# Patient Record
Sex: Male | Born: 1935 | Race: Black or African American | Hispanic: No | Marital: Married | Smoking: Former smoker
Health system: Southern US, Community
[De-identification: ages and names within clinical notes are randomized; demographics above are authoritative.]

## PROBLEM LIST (undated history)

## (undated) DIAGNOSIS — M1990 Unspecified osteoarthritis, unspecified site: Secondary | ICD-10-CM

## (undated) DIAGNOSIS — R972 Elevated prostate specific antigen [PSA]: Secondary | ICD-10-CM

## (undated) DIAGNOSIS — I10 Essential (primary) hypertension: Secondary | ICD-10-CM

## (undated) DIAGNOSIS — E785 Hyperlipidemia, unspecified: Secondary | ICD-10-CM

## (undated) DIAGNOSIS — K8020 Calculus of gallbladder without cholecystitis without obstruction: Secondary | ICD-10-CM

## (undated) HISTORY — DX: Hyperlipidemia, unspecified: E78.5

## (undated) HISTORY — PX: TONSILLECTOMY: SUR1361

## (undated) HISTORY — DX: Unspecified osteoarthritis, unspecified site: M19.90

## (undated) HISTORY — DX: Calculus of gallbladder without cholecystitis without obstruction: K80.20

## (undated) HISTORY — PX: HERNIA REPAIR: SHX51

## (undated) HISTORY — DX: Essential (primary) hypertension: I10

## (undated) HISTORY — PX: PROSTATE BIOPSY: SHX241

## (undated) HISTORY — DX: Elevated prostate specific antigen (PSA): R97.20

---

## 2005-05-03 ENCOUNTER — Ambulatory Visit: Payer: Self-pay | Admitting: Internal Medicine

## 2005-06-11 ENCOUNTER — Ambulatory Visit: Payer: Self-pay | Admitting: Internal Medicine

## 2005-07-12 ENCOUNTER — Ambulatory Visit: Payer: Self-pay | Admitting: Internal Medicine

## 2005-08-13 ENCOUNTER — Ambulatory Visit: Payer: Self-pay | Admitting: Internal Medicine

## 2005-11-13 ENCOUNTER — Ambulatory Visit: Payer: Self-pay | Admitting: Internal Medicine

## 2006-02-13 ENCOUNTER — Ambulatory Visit: Payer: Self-pay | Admitting: Internal Medicine

## 2006-04-08 ENCOUNTER — Ambulatory Visit: Payer: Self-pay | Admitting: Internal Medicine

## 2006-06-24 ENCOUNTER — Ambulatory Visit: Payer: Self-pay | Admitting: Internal Medicine

## 2006-06-24 DIAGNOSIS — R972 Elevated prostate specific antigen [PSA]: Secondary | ICD-10-CM

## 2006-06-24 HISTORY — DX: Elevated prostate specific antigen (PSA): R97.20

## 2006-06-24 LAB — CONVERTED CEMR LAB
AST: 19 units/L (ref 0–37)
Albumin: 3.7 g/dL (ref 3.5–5.2)
Alkaline Phosphatase: 70 units/L (ref 39–117)
Creatinine, Ser: 1.3 mg/dL (ref 0.4–1.5)
Total Bilirubin: 0.9 mg/dL (ref 0.3–1.2)
Total CHOL/HDL Ratio: 5.5

## 2006-07-03 ENCOUNTER — Ambulatory Visit: Payer: Self-pay | Admitting: Internal Medicine

## 2006-07-14 ENCOUNTER — Telehealth: Payer: Self-pay | Admitting: Internal Medicine

## 2006-08-07 ENCOUNTER — Encounter: Payer: Self-pay | Admitting: Internal Medicine

## 2006-08-26 DIAGNOSIS — E785 Hyperlipidemia, unspecified: Secondary | ICD-10-CM

## 2006-08-26 DIAGNOSIS — I10 Essential (primary) hypertension: Secondary | ICD-10-CM

## 2006-08-26 HISTORY — DX: Hyperlipidemia, unspecified: E78.5

## 2006-08-26 HISTORY — DX: Essential (primary) hypertension: I10

## 2006-08-28 ENCOUNTER — Ambulatory Visit: Payer: Self-pay | Admitting: Internal Medicine

## 2006-09-03 ENCOUNTER — Encounter: Payer: Self-pay | Admitting: Internal Medicine

## 2006-11-25 ENCOUNTER — Ambulatory Visit: Payer: Self-pay | Admitting: Internal Medicine

## 2006-11-25 LAB — CONVERTED CEMR LAB
Cholesterol, target level: 200 mg/dL
LDL Goal: 100 mg/dL

## 2007-05-26 ENCOUNTER — Ambulatory Visit: Payer: Self-pay | Admitting: Internal Medicine

## 2007-10-06 ENCOUNTER — Ambulatory Visit: Payer: Self-pay | Admitting: Internal Medicine

## 2007-10-06 LAB — CONVERTED CEMR LAB
ALT: 20 units/L (ref 0–53)
Albumin: 4.1 g/dL (ref 3.5–5.2)
BUN: 16 mg/dL (ref 6–23)
Basophils Relative: 0.6 % (ref 0.0–3.0)
Calcium: 9.2 mg/dL (ref 8.4–10.5)
Creatinine, Ser: 1.4 mg/dL (ref 0.4–1.5)
Eosinophils Relative: 2.4 % (ref 0.0–5.0)
GFR calc Af Amer: 64 mL/min
Glucose, Bld: 112 mg/dL — ABNORMAL HIGH (ref 70–99)
HCT: 40.6 % (ref 39.0–52.0)
Hemoglobin: 13.8 g/dL (ref 13.0–17.0)
Monocytes Absolute: 0.5 10*3/uL (ref 0.1–1.0)
Monocytes Relative: 9.2 % (ref 3.0–12.0)
Neutro Abs: 3.3 10*3/uL (ref 1.4–7.7)
RBC: 4.39 M/uL (ref 4.22–5.81)
RDW: 12.1 % (ref 11.5–14.6)
Total CHOL/HDL Ratio: 4.1
Total Protein: 6.9 g/dL (ref 6.0–8.3)
Triglycerides: 83 mg/dL (ref 0–149)
WBC: 5.6 10*3/uL (ref 4.5–10.5)

## 2007-10-13 ENCOUNTER — Ambulatory Visit: Payer: Self-pay | Admitting: Internal Medicine

## 2007-12-07 ENCOUNTER — Telehealth: Payer: Self-pay | Admitting: Internal Medicine

## 2007-12-31 ENCOUNTER — Telehealth: Payer: Self-pay | Admitting: Internal Medicine

## 2008-04-12 ENCOUNTER — Ambulatory Visit: Payer: Self-pay | Admitting: Internal Medicine

## 2008-10-07 ENCOUNTER — Ambulatory Visit: Payer: Self-pay | Admitting: Internal Medicine

## 2008-10-07 LAB — CONVERTED CEMR LAB
Albumin: 4.1 g/dL (ref 3.5–5.2)
CO2: 29 meq/L (ref 19–32)
Calcium: 9.4 mg/dL (ref 8.4–10.5)
Chloride: 111 meq/L (ref 96–112)
Cholesterol: 160 mg/dL (ref 0–200)
Direct LDL: 106.3 mg/dL
Sodium: 146 meq/L — ABNORMAL HIGH (ref 135–145)
Total Protein: 7.4 g/dL (ref 6.0–8.3)

## 2008-12-05 ENCOUNTER — Telehealth: Payer: Self-pay | Admitting: Internal Medicine

## 2009-04-11 ENCOUNTER — Ambulatory Visit: Payer: Self-pay | Admitting: Internal Medicine

## 2009-06-16 ENCOUNTER — Ambulatory Visit: Payer: Self-pay | Admitting: Internal Medicine

## 2009-06-16 LAB — CONVERTED CEMR LAB
BUN: 19 mg/dL (ref 6–23)
Chloride: 108 meq/L (ref 96–112)
Cholesterol: 178 mg/dL (ref 0–200)
GFR calc non Af Amer: 68.72 mL/min (ref >60)
Glucose, Bld: 90 mg/dL (ref 70–99)
HDL: 43.3 mg/dL (ref >39.00)
Potassium: 4.7 meq/L (ref 3.5–5.1)
Sodium: 148 meq/L — ABNORMAL HIGH (ref 135–145)
TSH: 1.89 microintl units/mL (ref 0.35–5.50)

## 2009-08-21 ENCOUNTER — Ambulatory Visit: Payer: Self-pay | Admitting: Internal Medicine

## 2009-10-10 ENCOUNTER — Telehealth: Payer: Self-pay | Admitting: Internal Medicine

## 2009-11-20 ENCOUNTER — Ambulatory Visit: Payer: Self-pay | Admitting: Internal Medicine

## 2009-11-20 LAB — CONVERTED CEMR LAB
CO2: 33 meq/L — ABNORMAL HIGH (ref 19–32)
Calcium: 9.4 mg/dL (ref 8.4–10.5)
Creatinine, Ser: 1.4 mg/dL (ref 0.4–1.5)
Sodium: 145 meq/L (ref 135–145)

## 2010-02-01 ENCOUNTER — Telehealth: Payer: Self-pay | Admitting: Internal Medicine

## 2010-02-13 NOTE — Assessment & Plan Note (Signed)
Summary: 3 month fup//ccm   Vital Signs:  Patient profile:   77 year old male Height:      68 inches Weight:      172 pounds BMI:     26.25 Temp:     98.2 degrees F oral Pulse rate:   68 / minute Resp:     14 per minute BP sitting:   152 / 76  (left arm)  Vitals Entered By: Willy Eddy, LPN (November 20, 2009 1:35 PM) CC: roa, Hypertension Management Is Patient Diabetic? No   Primary Care Avigail Pilling:  Stacie Glaze MD  CC:  roa and Hypertension Management.  History of Present Illness: the pt is on simvastatin and amlodipine and one of these has to be changed no chest pain, no muscle aches reported no syncopy no edema hx of BPH lipids in fair control on zocar   Hypertension History:      He denies headache, chest pain, palpitations, dyspnea with exertion, orthopnea, PND, peripheral edema, visual symptoms, neurologic problems, syncope, and side effects from treatment.        Positive major cardiovascular risk factors include male age 10 years old or older, hyperlipidemia, and hypertension.  Negative major cardiovascular risk factors include non-tobacco-user status.        Further assessment for target organ damage reveals no history of ASHD, stroke/TIA, or peripheral vascular disease.     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 Once Daily 2)  Furosemide 40 Mg  Tabs (Furosemide) .... Once Daily 3)  Benicar 40 Mg Tabs (Olmesartan Medoxomil) .Marland Kitchen.. 1 Once Daily 4)  Zocor 40 Mg  Tabs (Simvastatin)  .... Once Daily 5)  Mobic 15 Mg  Tabs (Meloxicam) .... Once Daily As Needed 6)  Amlodipine Besylate 10 Mg Tabs (Amlodipine Besylate) .Marland Kitchen.. 1 Once Daily 7)  Ultram 50 Mg Tabs (Tramadol Hcl) .... With 1 Tylenol 500 Every 6 Hours As Needed Pain 8)  Bystolic 20 Mg Tabs (Nebivolol Hcl) .... One By Mouth Daily  Current Medications (verified): 1)  Multivitamins   Caps (Multiple Vitamin) .... Once Daily 2)  Furosemide 40 Mg  Tabs (Furosemide) .... Once Daily 3)  Azor 10-40 Mg Tabs (Amlodipine-Olmesartan) .... One By Mouth Daily 4)  Crestor 10 Mg Tabs (Rosuvastatin Calcium) .... One By Mouth Daily 5)  Mobic 15 Mg  Tabs (Meloxicam) .... Once Daily As Needed 6)  Ultram 50 Mg Tabs (Tramadol Hcl) .... With 1 Tylenol 500 Every 6 Hours As Needed Pain 7)  Bystolic 20 Mg Tabs (Nebivolol Hcl) .... One By Mouth Daily  Allergies (verified): 1)  ! Pcn  Contraindications/Deferment of Procedures/Staging:    Test/Procedure: FLU VAX    Reason for deferment: patient declined   Past History:  Family History: Last updated: 08/28/2006 father had COPD mother had CVA Family History of Stroke F 1st degree relative <60  Social History: Last updated: 08/26/2006 Retired Married Never Smoked Alcohol use-no Drug use-no Regular exercise-yes  Risk Factors: Exercise: yes (08/26/2006)  Risk Factors: Smoking Status: never (11/20/2009)  Past medical, surgical,  family and social histories (including risk factors) reviewed, and no changes noted (except as noted below).  Past Medical History: Reviewed history from 08/28/2006 and no changes required. Hyperlipidemia Hypertension Arthritis Syphilis High Cholesterol Positive TB skin test  Past Surgical History: Reviewed history from 08/28/2006 and no changes required. Tonsillectomy prostate BX Inguinal herniorrhaphy  Family History: Reviewed history from 08/28/2006 and no changes required. father had COPD mother had CVA Family History of Stroke F 1st  degree relative <60  Social History: Reviewed history from 08/26/2006 and no changes required. Retired Married Never Smoked Alcohol use-no Drug use-no Regular exercise-yes  Review of Systems  The patient denies anorexia, fever, weight loss, weight gain, vision loss, decreased hearing, hoarseness, chest pain, syncope, dyspnea on exertion, peripheral edema, prolonged cough, headaches, hemoptysis, abdominal pain, melena, hematochezia, severe indigestion/heartburn, hematuria, incontinence, genital sores, muscle weakness, suspicious skin lesions, transient blindness, difficulty walking, depression, unusual weight change, abnormal bleeding, enlarged lymph nodes, angioedema, breast masses, and testicular masses.    Physical Exam  General:  Well-developed,well-nourished,in no acute distress; alert,appropriate and cooperative throughout examination Head:  normocephalic.   Ears:  R ear normal and L ear normal.   Nose:  no nasal discharge.   Neck:  No deformities, masses, or tenderness noted. Lungs:  normal respiratory effort and no crackles.   Heart:  normal rate and regular rhythm.   Abdomen:  soft, non-tender, and distended.   Msk:  no joint tenderness, no joint swelling, and no joint warmth.   Extremities:  trace left pedal edema and trace right pedal edema.   Neurologic:  alert & oriented X3 and finger-to-nose normal.     Impression & Recommendations:  Problem # 1:  HYPERTENSION (ICD-401.9)  moderate control on 4 drugs with blod pressures from home in the 150/70 and no significant edema The following medications were removed from the medication list:    Amlodipine Besylate 10 Mg Tabs (Amlodipine besylate) .Marland Kitchen... 1 once daily His updated medication list for this problem includes:    Furosemide 40 Mg Tabs (Furosemide) ..... Once daily    Azor 10-40 Mg Tabs (Amlodipine-olmesartan) ..... One by mouth daily    Bystolic 20 Mg Tabs (Nebivolol hcl) ..... One by mouth daily  BP today:  152/76 Prior BP: 150/67 (08/21/2009)  Prior 10 Yr Risk Heart Disease: 33 % (04/12/2008)  Labs Reviewed: K+: 4.7 (06/16/2009) Creat: : 1.3 (06/16/2009)   Chol: 178 (06/16/2009)   HDL: 43.30 (06/16/2009)   LDL: 115 (10/06/2007)   TG: 83 (10/06/2007)  Orders: Venipuncture (16109) TLB-BMP (Basic Metabolic Panel-BMET) (80048-METABOL)  Problem # 2:  HYPERLIPIDEMIA (ICD-272.4) due to the interaction of zocar and norvasc must change His updated medication list for this problem includes:    Crestor 10 Mg Tabs (Rosuvastatin calcium) ..... One by mouth daily  Labs Reviewed: SGOT: 17 (10/07/2008)   SGPT: 17 (10/07/2008)  Lipid Goals: Chol Goal: 200 (11/25/2006)   HDL Goal: 40 (11/25/2006)   LDL Goal: 100 (11/25/2006)   TG Goal: 150 (11/25/2006)  Prior 10 Yr Risk Heart Disease: 33 % (04/12/2008)   HDL:43.30 (06/16/2009), 35.10 (10/07/2008)  LDL:115 (10/06/2007), 129 (06/24/2006)  Chol:178 (06/16/2009), 160 (10/07/2008)  Trig:83 (10/06/2007), 130 (06/24/2006)  Complete Medication List: 1)  Multivitamins Caps (Multiple vitamin) .... Once daily 2)  Furosemide 40 Mg Tabs (Furosemide) .... Once daily 3)  Azor 10-40 Mg Tabs (Amlodipine-olmesartan) .... One by mouth daily 4)  Crestor 10 Mg Tabs (Rosuvastatin calcium) .... One by mouth daily 5)  Mobic 15 Mg Tabs (Meloxicam) .Marland KitchenMarland KitchenMarland Kitchen  Once daily as needed 6)  Ultram 50 Mg Tabs (Tramadol hcl) .... With 1 tylenol 500 every 6 hours as needed pain 7)  Bystolic 20 Mg Tabs (Nebivolol hcl) .... One by mouth daily  Hypertension Assessment/Plan:      The patient's hypertensive risk group is category B: At least one risk factor (excluding diabetes) with no target organ damage.  His calculated 10 year risk of coronary heart disease is 33 %.  Today's blood pressure is 152/76.  His blood pressure goal is < 140/90.  Patient Instructions: 1)  the azor replaces the amlodipine and the benicar 2)  Please schedule a follow-up appointment in 3  months. Prescriptions: CRESTOR 10 MG TABS (ROSUVASTATIN CALCIUM) one by mouth daily  #30 x 11   Entered and Authorized by:   Stacie Glaze MD   Signed by:   Stacie Glaze MD on 11/20/2009   Method used:   Electronically to        [facility] [address] (retail)       [address]       [place], [place]  [address]       Ph: [phone]       Fax: [phone]   RxID:   [id number] AZOR 10-40 MG TABS (AMLODIPINE-OLMESARTAN) one by mouth daily  #30 x 11   Entered and Authorized by:   Stacie Glaze MD   Signed by:   Stacie Glaze MD on 11/20/2009   Method used:   Electronically to        [facility] [address] (retail)       [address]       [place], [place]  [address]       Ph: [phone]       Fax: [phone]   RxID:   [id number]    Orders Added: 1)  Est. Patient Level IV [30160] 2)  Venipuncture [10932] 3)  TLB-BMP (Basic Metabolic Panel-BMET) [80048-METABOL]  Appended Document: Orders Update    Clinical Lists Changes  Orders: Added new Service order of Specimen Handling ([id number]) - Signed

## 2010-02-13 NOTE — Assessment & Plan Note (Signed)
Summary: roa/bmw   Vital Signs:  Patient profile:   77 year old male Height:      68 inches Weight:      180 pounds BMI:     27.47 Temp:     98.1 degrees F oral Pulse rate:   76 / minute Resp:     14 per minute BP sitting:   150 / 80  (left arm)  Vitals Entered By: Willy Eddy, LPN (April 11, 2009 3:46 PM)  Nutrition Counseling: Patient's BMI is greater than 25 and therefore counseled on weight management options. CC: roa, Hypertension Management   CC:  roa and Hypertension Management.  History of Present Illness: reviewed the labs from the last visit with good reportson total  and LDL as well as renal and liver function being stable HTN in poor control and pulse is higher that one would suspect with the labetolol  Hypertension History:      He denies headache, chest pain, palpitations, dyspnea with exertion, orthopnea, PND, peripheral edema, visual symptoms, neurologic problems, syncope, and side effects from treatment.        Positive major cardiovascular risk factors include male age 91 years old or older, hyperlipidemia, and hypertension.  Negative major cardiovascular risk factors include non-tobacco-user status.     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 Once Daily 2)  Furosemide 40 Mg  Tabs (Furosemide) .... Once Daily 3)  Benicar 40 Mg Tabs (Olmesartan Medoxomil) .Marland Kitchen.. 1 Once Daily 4)  Zocor 40 Mg  Tabs (Simvastatin) .... Once Daily 5)  Labetalol Hcl 200 Mg  Tabs (Labetalol Hcl) .... 2 Two Times A Day 6)  Mobic 15 Mg  Tabs (Meloxicam) ....  Once Daily As Needed 7)  Amlodipine Besylate 10 Mg Tabs (Amlodipine Besylate) .Marland Kitchen.. 1 Once Daily 8)  Ultram 50 Mg Tabs (Tramadol Hcl) .... With 1 Tylenol 500 Every 6 Hours As Needed Pain  Current Medications (verified): 1)  Multivitamins   Caps (Multiple Vitamin) .... Once Daily 2)  Furosemide 40 Mg  Tabs (Furosemide) .... Once Daily 3)  Benicar 40 Mg Tabs (Olmesartan Medoxomil) .Marland Kitchen.. 1 Once Daily 4)  Zocor 40 Mg  Tabs (Simvastatin) .... Once Daily 5)  Mobic 15 Mg  Tabs (Meloxicam) .... Once Daily As Needed 6)  Amlodipine Besylate 10 Mg Tabs (Amlodipine Besylate) .Marland Kitchen.. 1 Once Daily 7)  Ultram 50 Mg Tabs (Tramadol Hcl) .... With 1 Tylenol 500 Every 6 Hours As Needed Pain 8)  Bystolic 10 Mg Tabs (Nebivolol Hcl) .... One By Mouth Daily  Allergies (verified): 1)  ! Pcn  Past History:  Family History: Last updated: 08/28/2006 father had COPD mother had CVA Family History of Stroke F 1st degree relative <60  Social History: Last updated: 08/26/2006 Retired Married Never Smoked Alcohol use-no Drug use-no Regular exercise-yes  Risk Factors: Exercise: yes (08/26/2006)  Risk Factors: Smoking Status: never (04/11/2009)  Past medical, surgical, family and social histories (including risk factors) reviewed, and no changes noted (except as noted below).  Past Medical History: Reviewed history from 08/28/2006 and no changes required. Hyperlipidemia Hypertension Arthritis Syphilis High Cholesterol Positive TB skin  test  Past Surgical History: Reviewed history from 08/28/2006 and no changes required. Tonsillectomy prostate BX Inguinal herniorrhaphy  Family History: Reviewed history from 08/28/2006 and no changes required. father had COPD mother had CVA Family History of Stroke F 1st degree relative <60  Social History: Reviewed history from 08/26/2006 and no changes required. Retired Married Never Smoked Alcohol use-no Drug use-no Regular exercise-yes  Review of  Systems  The patient denies anorexia, fever, weight loss, weight gain, vision loss, decreased hearing, hoarseness, chest pain, syncope, dyspnea on exertion, peripheral edema, prolonged cough, headaches, hemoptysis, abdominal pain, melena, hematochezia, severe indigestion/heartburn, hematuria, incontinence, genital sores, muscle weakness, suspicious skin lesions, transient blindness, difficulty walking, depression, unusual weight change, abnormal bleeding, enlarged lymph nodes, angioedema, and breast masses.    Physical Exam  General:  Well-developed,well-nourished,in no acute distress; alert,appropriate and cooperative throughout examination Head:  normocephalic.   Ears:  R ear normal and L ear normal.   Nose:  no nasal discharge.   Mouth:  Oral mucosa and oropharynx without lesions or exudates.  Teeth in good repair. Neck:  No deformities, masses, or tenderness noted. Lungs:  normal respiratory effort and no crackles.   Heart:  normal rate and regular rhythm.   Abdomen:  soft, non-tender, and distended.   Msk:  no joint tenderness, no joint swelling, and no joint warmth.   Pulses:  R and L carotid,radial,femoral,dorsalis pedis and posterior tibial pulses are full and equal bilaterally Extremities:  trace left pedal edema and trace right pedal edema.   Neurologic:  alert & oriented X3 and gait normal.     Impression & Recommendations:  Problem # 1:  HYPERTENSION (ICD-401.9)  poor control and need for intervention with change of BB drug The following medications were removed from the medication list:    Labetalol Hcl 200 Mg Tabs (Labetalol hcl) .Marland Kitchen... 2 two times a day His updated medication list for this problem includes:    Furosemide 40 Mg Tabs (Furosemide) ..... Once daily    Benicar 40 Mg Tabs (Olmesartan medoxomil) .Marland Kitchen... 1 once daily    Amlodipine Besylate 10 Mg Tabs (Amlodipine besylate) .Marland Kitchen... 1 once daily    Bystolic 10 Mg Tabs (Nebivolol hcl) ..... One by mouth daily  BP  today: 150/80 Prior BP: 146/72 (10/07/2008)  Prior 10 Yr Risk Heart Disease: 33 % (04/12/2008)  Labs Reviewed: K+: 4.4 (10/07/2008) Creat: : 1.7 (10/07/2008)   Chol: 160 (10/07/2008)   HDL: 35.10 (10/07/2008)   LDL: 115 (10/06/2007)   TG: 83 (10/06/2007)  Problem # 2:  HYPERLIPIDEMIA (ICD-272.4) stable His updated medication list for this problem includes:    Zocor 40 Mg Tabs (Simvastatin) ..... Once daily  Labs Reviewed: SGOT: 17 (10/07/2008)   SGPT: 17 (10/07/2008)  Lipid Goals: Chol Goal: 200 (11/25/2006)   HDL Goal: 40 (11/25/2006)   LDL Goal: 100 (11/25/2006)   TG Goal: 150 (11/25/2006)  Prior 10 Yr Risk Heart Disease: 33 % (04/12/2008)   HDL:35.10 (10/07/2008), 42.0 (10/06/2007)  LDL:115 (10/06/2007), 129 (06/24/2006)  Chol:160 (10/07/2008), 174 (10/06/2007)  Trig:83 (10/06/2007), 130 (06/24/2006)  Complete Medication List: 1)  Multivitamins Caps (Multiple vitamin) .... Once daily 2)  Furosemide 40 Mg Tabs (Furosemide) .... Once daily 3)  Benicar 40 Mg Tabs (Olmesartan medoxomil) .Marland Kitchen.. 1 once daily 4)  Zocor 40 Mg Tabs (Simvastatin) .... Once daily 5)  Mobic 15 Mg Tabs (Meloxicam) .... Once daily as needed 6)  Amlodipine Besylate 10 Mg Tabs (Amlodipine besylate) .Marland Kitchen.. 1 once daily 7)  Ultram 50 Mg  Tabs (Tramadol hcl) .... With 1 tylenol 500 every 6 hours as needed pain 8)  Bystolic 10 Mg Tabs (Nebivolol hcl) .... One by mouth daily  Hypertension Assessment/Plan:      The patient's hypertensive risk group is category B: At least one risk factor (excluding diabetes) with no target organ damage.  His calculated 10 year risk of coronary heart disease is 33 %.  Today's blood pressure is 150/80.  His blood pressure goal is < 140/90.  Patient Instructions: 1)  Please schedule a follow-up appointment in 2 months.

## 2010-02-13 NOTE — Assessment & Plan Note (Signed)
Summary: 2 month follow up/cjr/pt rescd from bump//ccm   Vital Signs:  Patient profile:   77 year old male Height:      68 inches Weight:      177 pounds BMI:     27.01 Temp:     98.2 degrees F oral Pulse rate:   80 / minute Resp:     14 per minute BP sitting:   157 / 80  (left arm)  Vitals Entered By: Willy Eddy, LPN (June 16, 1608 1:35 PM) CC: roa after changing lebatalol to bystolic, Hypertension Management   CC:  roa after changing lebatalol to bystolic and Hypertension Management.  History of Present Illness: the pt presents  for follow up of blood pressure   Hypertension History:      He denies headache, chest pain, palpitations, dyspnea with exertion, orthopnea, PND, peripheral edema, visual symptoms, neurologic problems, syncope, and side effects from treatment.  no chest pain.        Positive major cardiovascular risk factors include male age 61 years old or older, hyperlipidemia, and hypertension.  Negative major cardiovascular risk factors include non-tobacco-user status.     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 Once Daily 2)  Furosemide 40 Mg  Tabs (Furosemide) .... Once Daily 3)  Benicar 40 Mg Tabs (Olmesartan Medoxomil) .Marland Kitchen.. 1 Once Daily 4)  Zocor 40 Mg  Tabs (Simvastatin) .... Once Daily 5)  Mobic 15 Mg  Tabs (Meloxicam) .... Once Daily As Needed 6)  Amlodipine Besylate 10 Mg Tabs (Amlodipine Besylate) .Marland Kitchen.. 1 Once Daily 7)  Ultram 50 Mg Tabs (Tramadol Hcl) .... With 1 Tylenol 500 Every 6 Hours As Needed Pain 8)  Bystolic 20 Mg Tabs (Nebivolol Hcl) .... One By Mouth Daily  Allergies (verified): 1)  ! Pcn  Past History:  Family History: Last updated: 08/28/2006 father  had COPD mother had CVA Family History of Stroke F 1st degree relative <60  Social History: Last updated: 08/26/2006 Retired Married Never Smoked Alcohol use-no Drug use-no Regular exercise-yes  Risk Factors: Exercise: yes (08/26/2006)  Risk Factors: Smoking Status: never (06/16/2009)  Past medical, surgical, family and social histories (including risk factors) reviewed, and no changes noted (except as noted below).  Past Medical History: Reviewed history from 08/28/2006 and no changes required. Hyperlipidemia Hypertension Arthritis Syphilis High Cholesterol Positive TB skin test  Past Surgical History: Reviewed history from 08/28/2006 and no changes required. Tonsillectomy prostate BX Inguinal herniorrhaphy  Family History: Reviewed history from 08/28/2006 and no changes required. father had COPD mother had CVA Family History of Stroke F 1st degree relative <60  Social History: Reviewed history from 08/26/2006 and no changes required. Retired Married Never Smoked Alcohol use-no Drug use-no Regular exercise-yes  Review of Systems       The patient complains of anorexia.  The patient denies fever, weight loss, weight gain, vision loss, decreased hearing, hoarseness, chest pain, syncope, dyspnea on exertion, peripheral edema, prolonged cough, headaches, hemoptysis, abdominal pain, melena, hematochezia, severe indigestion/heartburn, hematuria, incontinence, genital sores, muscle weakness, suspicious skin lesions, transient blindness, difficulty walking, depression, unusual weight change, abnormal bleeding, enlarged lymph nodes, angioedema, and breast masses.    Physical Exam  General:  Well-developed,well-nourished,in no acute distress; alert,appropriate and cooperative throughout examination Head:  normocephalic.   Ears:  R ear normal and L ear normal.   Nose:  no nasal discharge.   Mouth:  Oral mucosa and oropharynx without lesions or exudates.  Teeth in  good repair. Neck:  No deformities, masses, or tenderness noted. Lungs:  normal respiratory effort and no crackles.   Heart:  normal rate and regular rhythm.   Abdomen:  soft, non-tender, and distended.   Msk:  no joint tenderness, no joint swelling, and no joint warmth.   Neurologic:  alert & oriented X3 and gait normal.     Impression & Recommendations:  Problem # 1:  HYPERTENSION (ICD-401.9) Assessment Deteriorated  slight increased in systollic over the last few reading indicates the need to increased the bystollic His updated medication list for this problem includes:    Furosemide 40 Mg Tabs (Furosemide) ..... Once daily    Benicar 40 Mg Tabs (Olmesartan medoxomil) .Marland Kitchen... 1 once daily    Amlodipine Besylate 10 Mg Tabs (Amlodipine besylate) .Marland Kitchen... 1 once daily    Bystolic 20 Mg Tabs (Nebivolol hcl) ..... One by mouth daily  BP today: 157/80 Prior BP: 150/80 (04/11/2009)  Prior 10 Yr Risk Heart Disease: 33 % (04/12/2008)  Labs Reviewed: K+: 4.4 (10/07/2008) Creat: : 1.7 (10/07/2008)   Chol: 160 (10/07/2008)   HDL: 35.10 (10/07/2008)   LDL: 115 (10/06/2007)   TG: 83 (10/06/2007)  Problem # 2:  HYPERLIPIDEMIA (ICD-272.4)  His updated medication list for this problem includes:    Zocor 40 Mg Tabs (Simvastatin) ..... Once daily  Orders: TLB-Cholesterol, HDL (83718-HDL) TLB-Cholesterol, Direct LDL (83721-DIRLDL) TLB-Cholesterol, Total (82465-CHO) TLB-TSH (Thyroid Stimulating Hormone) (84443-TSH)  Labs Reviewed: SGOT: 17 (10/07/2008)   SGPT: 17 (10/07/2008)  Lipid Goals: Chol Goal: 200 (11/25/2006)   HDL Goal: 40 (11/25/2006)   LDL Goal: 100 (11/25/2006)   TG Goal: 150 (11/25/2006)  Prior 10 Yr Risk Heart Disease: 33 % (04/12/2008)   HDL:35.10 (10/07/2008), 42.0 (10/06/2007)  LDL:115 (10/06/2007), 129 (06/24/2006)  Chol:160 (10/07/2008), 174 (10/06/2007)  Trig:83 (10/06/2007), 130 (06/24/2006)  Complete Medication List: 1)  Multivitamins Caps (Multiple vitamin) ....  Once daily 2)  Furosemide 40 Mg Tabs (Furosemide) .... Once daily 3)  Benicar 40 Mg Tabs (Olmesartan medoxomil) .Marland Kitchen.. 1 once daily 4)  Zocor 40 Mg Tabs (Simvastatin) .... Once daily 5)  Mobic 15 Mg Tabs (Meloxicam) .... Once daily as needed 6)  Amlodipine Besylate 10 Mg Tabs (Amlodipine besylate) .Marland Kitchen.. 1 once daily 7)  Ultram 50 Mg Tabs (Tramadol hcl) .... With 1 tylenol 500 every 6 hours as needed pain 8)  Bystolic 20 Mg Tabs (Nebivolol hcl) .... One by mouth daily  Other Orders: TLB-BMP (Basic Metabolic Panel-BMET) (80048-METABOL) Venipuncture (16109)  Hypertension Assessment/Plan:      The patient's hypertensive risk group is category B: At least one risk factor (excluding diabetes) with no target organ damage.  His calculated 10 year risk of coronary heart disease is 33 %.  Today's blood pressure is 157/80.  His blood pressure goal is < 140/90.  Patient Instructions: 1)  Please schedule a follow-up appointment in 2 months.

## 2010-02-13 NOTE — Assessment & Plan Note (Signed)
Summary: 2 month fup//ccm   Vital Signs:  Patient profile:   77 year old male Height:      68 inches Weight:      175 pounds BMI:     26.70 Temp:     98.2 degrees F oral Pulse rate:   68 / minute Resp:     14 per minute BP sitting:   150 / 67  (left arm)  Vitals Entered By: Willy Eddy, LPN (August 21, 2009 1:33 PM) CC: roa bp check, Hypertension Management, Lipid Management Is Patient Diabetic? No   CC:  roa bp check, Hypertension Management, and Lipid Management.  History of Present Illness: discussion of the medicatons and the recent labs the blood pressure was initially up today has been eating cured ham no chest pains, no SOB monterinf of lipids and drugs side effects: none  Hypertension History:      He denies headache, chest pain, palpitations, dyspnea with exertion, orthopnea, PND, peripheral edema, visual symptoms, neurologic problems, syncope, and side effects from treatment.  Further comments include: eating salt cured foods!.        Positive major cardiovascular risk factors include male age 22 years old or older, hyperlipidemia, and hypertension.  Negative major cardiovascular risk factors include non-tobacco-user status.        Further assessment for target organ damage reveals no history of ASHD, stroke/TIA, or peripheral vascular disease.    Lipid Management History:      Positive NCEP/ATP III risk factors include male age 37 years old or older and hypertension.  Negative NCEP/ATP III risk factors include non-tobacco-user status, no ASHD (atherosclerotic heart disease), no prior stroke/TIA, no peripheral vascular disease, and no history of aortic aneurysm.      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 Once Daily 2)  Furosemide 40 Mg  Tabs (Furosemide) .... Once Daily 3)  Benicar 40 Mg Tabs (Olmesartan Medoxomil) .Marland Kitchen.. 1 Once Daily 4)  Zocor 40 Mg  Tabs (Simvastatin) .... Once Daily 5)  Mobic 15 Mg  Tabs (Meloxicam) .... Once Daily As Needed 6)  Amlodipine Besylate 10 Mg Tabs (Amlodipine Besylate) .Marland Kitchen.. 1 Once Daily 7)  Ultram 50 Mg Tabs (Tramadol Hcl) .... With 1 Tylenol 500 Every 6 Hours As Needed Pain 8)  Bystolic 20 Mg Tabs (Nebivolol Hcl) .... One By Mouth Daily  Current Medications (verified): 1)  Multivitamins   Caps (Multiple Vitamin) .... Once Daily 2)  Furosemide 40 Mg  Tabs (Furosemide) .... Once Daily 3)  Benicar 40 Mg Tabs (Olmesartan Medoxomil) .Marland Kitchen.. 1 Once Daily 4)  Zocor 40 Mg  Tabs (Simvastatin) .... Once Daily 5)  Mobic 15 Mg  Tabs (Meloxicam) .... Once Daily As Needed 6)  Amlodipine Besylate 10 Mg Tabs (Amlodipine Besylate) .Marland Kitchen.. 1 Once Daily 7)  Ultram 50 Mg Tabs (Tramadol Hcl) .... With 1 Tylenol 500 Every 6 Hours As Needed Pain 8)  Bystolic 20 Mg Tabs (Nebivolol Hcl) .... One By Mouth Daily  Allergies (verified): 1)  ! Pcn  Past History:  Family History: Last updated: 08/28/2006 father had COPD mother had CVA Family History of  Stroke F 1st degree relative <60  Social History: Last updated: 08/26/2006 Retired Married Never Smoked Alcohol use-no Drug use-no Regular exercise-yes  Risk Factors: Exercise: yes (08/26/2006)  Risk Factors: Smoking Status: never (08/21/2009)  Past medical, surgical, family and social histories (including risk factors) reviewed, and no changes noted (except as noted below).  Past Medical History: Reviewed history from 08/28/2006 and no changes required. Hyperlipidemia Hypertension Arthritis Syphilis High Cholesterol Positive TB skin test  Past  Surgical History: Reviewed history from 08/28/2006 and no changes required. Tonsillectomy prostate BX Inguinal herniorrhaphy  Family History: Reviewed history from 08/28/2006 and no changes required. father had COPD mother had CVA Family History of Stroke F 1st degree relative <60  Social History: Reviewed history from 08/26/2006 and no changes required. Retired Married Never Smoked Alcohol use-no Drug use-no Regular exercise-yes  Review of Systems  The patient denies anorexia, fever, weight loss, weight gain, vision loss, decreased hearing, hoarseness, chest pain, syncope, dyspnea on exertion, peripheral edema, prolonged cough, headaches, hemoptysis, abdominal pain, melena, hematochezia, severe indigestion/heartburn, hematuria, incontinence, genital sores, muscle weakness, suspicious skin lesions, transient blindness, difficulty walking, depression, unusual weight change, abnormal bleeding, enlarged lymph nodes, angioedema, and breast masses.    Physical Exam  General:  Well-developed,well-nourished,in no acute distress; alert,appropriate and cooperative throughout examination Head:  normocephalic.   Ears:  R ear normal and L ear normal.   Nose:  no nasal discharge.   Mouth:  Oral mucosa and oropharynx without lesions or exudates.  Teeth in good repair. Neck:  No deformities, masses, or tenderness noted. Lungs:  normal respiratory effort and no crackles.   Heart:  normal rate and regular rhythm.   Abdomen:  soft, non-tender, and distended.   Msk:  no joint tenderness, no joint swelling, and no joint warmth.   Extremities:  trace left pedal edema and trace right pedal edema.   Neurologic:  alert & oriented X3 and gait normal.     Impression & Recommendations:  Problem # 1:  HYPERTENSION (ICD-401.9) Assessment Unchanged  His updated medication list for this problem includes:    Furosemide 40 Mg Tabs (Furosemide) ..... Once daily    Benicar 40 Mg Tabs (Olmesartan  medoxomil) .Marland Kitchen... 1 once daily    Amlodipine Besylate 10 Mg Tabs (Amlodipine besylate) .Marland Kitchen... 1 once daily    Bystolic 20 Mg Tabs (Nebivolol hcl) ..... One by mouth daily  BP today: 150/67 Prior BP: 157/80 (06/16/2009)  Prior 10 Yr Risk Heart Disease: 33 % (04/12/2008)  Labs Reviewed: K+: 4.7 (06/16/2009) Creat: : 1.3 (06/16/2009)   Chol: 178 (06/16/2009)   HDL: 43.30 (06/16/2009)   LDL: 115 (10/06/2007)   TG: 83 (10/06/2007)  Problem # 2:  HYPERLIPIDEMIA (ICD-272.4) Assessment: Unchanged  His updated medication list for this problem includes:    Zocor 40 Mg Tabs (Simvastatin) ..... Once daily  Labs Reviewed: SGOT: 17 (10/07/2008)   SGPT: 17 (10/07/2008)  Lipid Goals: Chol Goal: 200 (11/25/2006)   HDL Goal: 40 (11/25/2006)   LDL Goal: 100 (11/25/2006)   TG Goal: 150 (11/25/2006)  Prior 10 Yr Risk Heart Disease: 33 % (04/12/2008)   HDL:43.30 (06/16/2009), 35.10 (10/07/2008)  LDL:115 (10/06/2007), 129 (06/24/2006)  Chol:178 (06/16/2009), 160 (10/07/2008)  Trig:83 (10/06/2007), 130 (06/24/2006)  Problem # 3:  PROSTATE SPECIFIC ANTIGEN, ELEVATED (ICD-790.93) pr refused futher work up  Complete Medication List: 1)  Multivitamins Caps (Multiple vitamin) .... Once daily 2)  Furosemide 40 Mg Tabs (Furosemide) .... Once daily 3)  Benicar 40 Mg Tabs (Olmesartan medoxomil) .Marland KitchenMarland KitchenMarland Kitchen  1 once daily 4)  Zocor 40 Mg Tabs (Simvastatin) .... Once daily 5)  Mobic 15 Mg Tabs (Meloxicam) .... Once daily as needed 6)  Amlodipine Besylate 10 Mg Tabs (Amlodipine besylate) .Marland Kitchen.. 1 once daily 7)  Ultram 50 Mg Tabs (Tramadol hcl) .... With 1 tylenol 500 every 6 hours as needed pain 8)  Bystolic 20 Mg Tabs (Nebivolol hcl) .... One by mouth daily  Hypertension Assessment/Plan:      The patient's hypertensive risk group is category B: At least one risk factor (excluding diabetes) with no target organ damage.  His calculated 10 year risk of coronary heart disease is 33 %.  Today's blood pressure is 150/67.  His  blood pressure goal is < 140/90.  Lipid Assessment/Plan:      Based on NCEP/ATP III, the patient's risk factor category is "2 or more risk factors and a calculated 10 year CAD risk of > 20%".  The patient's lipid goals are as follows: Total cholesterol goal is 200; LDL cholesterol goal is 100; HDL cholesterol goal is 40; Triglyceride goal is 150.  His LDL cholesterol goal has not been met.  Secondary causes for hyperlipidemia have been ruled out.  He has been counseled on adjunctive measures for lowering his cholesterol and has been provided with dietary instructions.    Patient Instructions: 1)  Please schedule a follow-up appointment in 3 months.

## 2010-02-13 NOTE — Progress Notes (Signed)
Summary: samples  Phone Note Call from Patient Call back at Home Phone [phone]   Caller: Patient Call For: Stacie Glaze MD Summary of Call: pt needs more bystolic samples 10 mg or 20mg .wife will be here today Initial call taken by: Heron Sabins,  October 10, 2009 10:08 AM  Follow-up for Phone Call        given by dr Lovell Sheehan Follow-up by: Willy Eddy, LPN,  October 10, 2009 2:48 PM

## 2010-02-15 NOTE — Progress Notes (Signed)
Summary: samples needed  Phone Note Call from Patient Call back at Home Phone [phone]   Caller: Patient---live call Summary of Call: requesting more Bystolic samples until his appt next month. Initial call taken by: Warnell Forester,  February 01, 2010 9:37 AM    Prescriptions: BYSTOLIC 20 MG TABS (NEBIVOLOL HCL) one by mouth daily  #30 x 1   Entered by:   Willy Eddy, LPN   Authorized by:   Stacie Glaze MD   Signed by:   Willy Eddy, LPN on 29/56/2130   Method used:   Electronically to        [facility] [address] (retail)       [address]       [place], [place]  [address]       Ph: [phone]       Fax: [phone]   RxID:   [id number]

## 2010-02-27 ENCOUNTER — Other Ambulatory Visit: Payer: Self-pay | Admitting: Internal Medicine

## 2010-03-05 ENCOUNTER — Ambulatory Visit (INDEPENDENT_AMBULATORY_CARE_PROVIDER_SITE_OTHER): Payer: Medicare Other | Admitting: Internal Medicine

## 2010-03-05 ENCOUNTER — Encounter: Payer: Self-pay | Admitting: Internal Medicine

## 2010-03-05 VITALS — BP 150/80 | HR 72 | Temp 98.1°F | Resp 14 | Ht 68.0 in | Wt 170.0 lb

## 2010-03-05 DIAGNOSIS — I10 Essential (primary) hypertension: Secondary | ICD-10-CM

## 2010-03-05 DIAGNOSIS — E785 Hyperlipidemia, unspecified: Secondary | ICD-10-CM

## 2010-03-05 LAB — BASIC METABOLIC PANEL
BUN: 15 mg/dL (ref 6–23)
Creatinine, Ser: 1.3 mg/dL (ref 0.4–1.5)
GFR: 66.81 mL/min (ref >60.00)
Glucose, Bld: 118 mg/dL — ABNORMAL HIGH (ref 70–99)

## 2010-03-05 MED ORDER — AMLODIPINE-OLMESARTAN 10-40 MG PO TABS: 1.0000 | ORAL_TABLET | Freq: Every day | ORAL | Status: DC

## 2010-03-05 NOTE — Progress Notes (Signed)
  Subjective:    Patient ID: John Hutchinson, male    DOB: [birth year], 77 y.o.   MRN: [id number]  HPI  patient is a 77 year old African American male who presents for followup of his hypertension.  He was placed on a azor  10/40 daily and has tolerated the medicine without any side effects his blood pressures improved at 150/80 but not approximately at the goal we would like for him there may be problem in that he received some samples of 5/20 he was told to double anemia been taking only a single dose of those medications therefore we have given him only samples of 10/40 and will monitor his blood pressure over the next month.     Review of Systems  Constitutional: Negative for fever and fatigue.  HENT: Negative for hearing loss, congestion, neck pain and postnasal drip.   Eyes: Negative for discharge, redness and visual disturbance.  Respiratory: Negative for cough, shortness of breath and wheezing.   Cardiovascular: Negative for leg swelling.  Gastrointestinal: Negative for abdominal pain, constipation and abdominal distention.  Genitourinary: Negative for urgency and frequency.  Musculoskeletal: Negative for joint swelling and arthralgias.  Skin: Negative for color change and rash.  Neurological: Negative for weakness and light-headedness.  Hematological: Negative for adenopathy.  Psychiatric/Behavioral: Negative for behavioral problems.       Objective:   Physical Exam  Constitutional: He appears well-developed and well-nourished.  HENT:  Head: Normocephalic.  Right Ear: External ear normal.  Left Ear: External ear normal.  Eyes: Conjunctivae are normal. Pupils are equal, round, and reactive to light.  Neck: Normal range of motion. Neck supple.  Cardiovascular: Normal rate and regular rhythm.   Pulmonary/Chest: Effort normal and breath sounds normal.  Abdominal: Soft. Bowel sounds are normal.          Assessment & Plan:   patient is an elderly African American male who  is on multiple medicines for his hypertension his blood pressure is improved but not at goal this may be due to confusion over the samples given he is given samples only of the 10/40 and will monitor for compliance and in fact over the next

## 2010-03-05 NOTE — Patient Instructions (Signed)
Be sure to take the Azor or 40/10 one by mouth daily.

## 2010-04-17 ENCOUNTER — Telehealth: Payer: Self-pay | Admitting: Internal Medicine

## 2010-04-17 NOTE — Telephone Encounter (Signed)
Pt needs more samples of bystolic 20mg , azor 10-40mg  and crestor 10 mg

## 2010-04-18 NOTE — Telephone Encounter (Signed)
Samples of azor out front

## 2010-04-19 ENCOUNTER — Other Ambulatory Visit: Payer: Self-pay | Admitting: *Deleted

## 2010-04-19 MED ORDER — ROSUVASTATIN CALCIUM 10 MG PO TABS: 10.0000 mg | ORAL_TABLET | Freq: Every day | ORAL | Status: DC

## 2010-06-04 ENCOUNTER — Encounter: Payer: Self-pay | Admitting: Internal Medicine

## 2010-06-04 ENCOUNTER — Ambulatory Visit (INDEPENDENT_AMBULATORY_CARE_PROVIDER_SITE_OTHER): Payer: Medicare Other | Admitting: Internal Medicine

## 2010-06-04 VITALS — BP 155/84 | HR 92 | Temp 99.4°F | Ht 66.0 in | Wt 169.0 lb

## 2010-06-04 DIAGNOSIS — E785 Hyperlipidemia, unspecified: Secondary | ICD-10-CM

## 2010-06-04 DIAGNOSIS — I10 Essential (primary) hypertension: Secondary | ICD-10-CM

## 2010-06-04 DIAGNOSIS — M1990 Unspecified osteoarthritis, unspecified site: Secondary | ICD-10-CM

## 2010-06-04 NOTE — Progress Notes (Signed)
  Subjective:    Patient ID: John Hutchinson, male    DOB: 1933/02/12, 77 y.o.   MRN: [id number]  HPI Patient is a 77 year old male with a history of hypertension and hyperlipidemia.  He presents today with a history of hypertension and home readings of blood pressure that have been ranging from 140-130/70-60.  He's been compliant with his medication reports no side effects chest pain shortness of breath.  He does have a stressful role in that he cares for his wife He exercises 6 days a week He has moderately severe osteoarthritis and has been taking some nonsteroidals for pain relief   Review of Systems  Constitutional: Negative for fever and fatigue.  HENT: Negative for hearing loss, congestion, neck pain and postnasal drip.   Eyes: Negative for discharge, redness and visual disturbance.  Respiratory: Negative for cough, shortness of breath and wheezing.   Cardiovascular: Negative for leg swelling.  Gastrointestinal: Negative for abdominal pain, constipation and abdominal distention.  Genitourinary: Negative for urgency and frequency.  Musculoskeletal: Negative for joint swelling and arthralgias.  Skin: Negative for color change and rash.  Neurological: Negative for weakness and light-headedness.  Hematological: Negative for adenopathy.  Psychiatric/Behavioral: Negative for behavioral problems.       Objective:   Physical Exam    Blood pressure 155/84, pulse 92, temperature 99.4 F (37.4 C), temperature source Oral, height 5\' 6"  (1.676 m), weight 169 lb (76.658 kg). Elderly African American male in no apparent distress moderately disheveled today HEENT showed arcus senilis pupils were equal round reactive to light and accommodation neck was supple without bruit heart examination revealed a regular rate and rhythm a 1/6 systolic murmur. Abdomen was soft and nontender extremity examination no trace edema neurological examination found equal grips alert oriented to person place and  time appropriate judgment and cognition    Assessment & Plan:  Hypertension blood pressure stable his current medications the plan of changing on his Azor Yussef has moderately severe osteoarthritis his primary problem is after sitting he has a lot of joint stiffness which impairs his initial movement when she is moving he is doing well with her discontinuing them meloxicam and his exercise. He has tramadol for when necessary use for extreme pain

## 2010-07-24 ENCOUNTER — Other Ambulatory Visit: Payer: Self-pay | Admitting: Internal Medicine

## 2010-08-07 ENCOUNTER — Other Ambulatory Visit: Payer: Self-pay | Admitting: Internal Medicine

## 2010-09-06 ENCOUNTER — Other Ambulatory Visit: Payer: Self-pay | Admitting: Internal Medicine

## 2010-09-18 ENCOUNTER — Other Ambulatory Visit: Payer: Self-pay | Admitting: Internal Medicine

## 2010-10-04 ENCOUNTER — Other Ambulatory Visit: Payer: Self-pay | Admitting: Internal Medicine

## 2010-10-18 ENCOUNTER — Other Ambulatory Visit: Payer: Self-pay | Admitting: Internal Medicine

## 2010-10-29 ENCOUNTER — Ambulatory Visit (INDEPENDENT_AMBULATORY_CARE_PROVIDER_SITE_OTHER): Payer: Medicare Other | Admitting: Internal Medicine

## 2010-10-29 ENCOUNTER — Encounter: Payer: Self-pay | Admitting: Internal Medicine

## 2010-10-29 VITALS — BP 144/80 | HR 76 | Temp 98.6°F | Resp 16 | Ht 66.0 in | Wt 163.0 lb

## 2010-10-29 DIAGNOSIS — M25559 Pain in unspecified hip: Secondary | ICD-10-CM

## 2010-10-29 DIAGNOSIS — I10 Essential (primary) hypertension: Secondary | ICD-10-CM

## 2010-10-29 NOTE — Progress Notes (Signed)
  Subjective:    Patient ID: John Hutchinson, male    DOB: March 14, 1933, 77 y.o.   MRN: [id number]  HPI Hip pain with gate issues htn lipids    Review of Systems     Objective:   Physical Exam        Assessment & Plan:  Blood pressure check Tramadol for hip pain

## 2010-10-29 NOTE — Patient Instructions (Signed)
Patient was instructed to continue all medications as prescribed. To stop at the checkout desk and schedule a followup appointment Keep walking every day

## 2010-11-05 ENCOUNTER — Ambulatory Visit: Payer: Medicare Other | Admitting: Internal Medicine

## 2010-11-12 ENCOUNTER — Other Ambulatory Visit: Payer: Self-pay | Admitting: Internal Medicine

## 2011-02-08 ENCOUNTER — Other Ambulatory Visit: Payer: Self-pay | Admitting: Internal Medicine

## 2011-03-01 ENCOUNTER — Ambulatory Visit (INDEPENDENT_AMBULATORY_CARE_PROVIDER_SITE_OTHER): Payer: Medicare Other | Admitting: Internal Medicine

## 2011-03-01 VITALS — BP 146/80 | HR 76 | Temp 98.6°F | Resp 16 | Ht 66.0 in | Wt 160.0 lb

## 2011-03-01 DIAGNOSIS — E785 Hyperlipidemia, unspecified: Secondary | ICD-10-CM | POA: Diagnosis not present

## 2011-03-01 DIAGNOSIS — T887XXA Unspecified adverse effect of drug or medicament, initial encounter: Secondary | ICD-10-CM

## 2011-03-01 DIAGNOSIS — I10 Essential (primary) hypertension: Secondary | ICD-10-CM | POA: Diagnosis not present

## 2011-03-01 LAB — BASIC METABOLIC PANEL
BUN: 16 mg/dL (ref 6–23)
Calcium: 9.1 mg/dL (ref 8.4–10.5)
GFR: 72.2 mL/min (ref >60.00)
Glucose, Bld: 82 mg/dL (ref 70–99)
Potassium: 4.4 mEq/L (ref 3.5–5.1)
Sodium: 144 mEq/L (ref 135–145)

## 2011-03-01 LAB — LIPID PANEL
Cholesterol: 166 mg/dL (ref 0–200)
HDL: 51.6 mg/dL (ref >39.00)
LDL Cholesterol: 91 mg/dL (ref 0–99)
VLDL: 23.6 mg/dL (ref 0.0–40.0)

## 2011-03-01 LAB — HEPATIC FUNCTION PANEL
Albumin: 4.2 g/dL (ref 3.5–5.2)
Alkaline Phosphatase: 66 U/L (ref 39–117)
Total Bilirubin: 0.5 mg/dL (ref 0.3–1.2)

## 2011-03-01 LAB — CBC WITH DIFFERENTIAL/PLATELET
Basophils Absolute: 0.1 10*3/uL (ref 0.0–0.1)
Eosinophils Absolute: 0.1 10*3/uL (ref 0.0–0.7)
Lymphocytes Relative: 25.1 % (ref 12.0–46.0)
MCHC: 32.8 g/dL (ref 30.0–36.0)
MCV: 93.3 fl (ref 78.0–100.0)
Monocytes Absolute: 0.6 10*3/uL (ref 0.1–1.0)
Neutrophils Relative %: 64.7 % (ref 43.0–77.0)
Platelets: 254 10*3/uL (ref 150.0–400.0)

## 2011-03-01 LAB — TSH: TSH: 1.3 u[IU]/mL (ref 0.35–5.50)

## 2011-03-01 NOTE — Progress Notes (Signed)
Subjective:    Patient ID: John Hutchinson, male    DOB: January 23, 1933, 78 y.o.   MRN: [id number]  HPI Magda Kiel is a 78 year old African American male is followed for hypertension hyperlipidemia and osteoarthritis.  He has been on a combination drug of amlodipine and Benicar with 20 mg of diastolic control his blood pressure and has generally done well.  After 15 minutes his blood pressure was rechecked today and was 138/88. He has osteoarthritis he takes Mobic and we cautioned him not to take any additional over-the-counter medications such as Advil or Aleve because they could both affect his blood pressure and put him at risk for kidney damage and/or ulcers. He was given a prescription for Ultram to use in lieu of other nonsteroidals     Review of Systems  Constitutional: Negative for fever and fatigue.  HENT: Negative for hearing loss, congestion, neck pain and postnasal drip.   Eyes: Negative for discharge, redness and visual disturbance.  Respiratory: Negative for cough, shortness of breath and wheezing.   Cardiovascular: Negative for leg swelling.  Gastrointestinal: Negative for abdominal pain, constipation and abdominal distention.  Genitourinary: Negative for urgency and frequency.  Musculoskeletal: Negative for joint swelling and arthralgias.  Skin: Negative for color change and rash.  Neurological: Negative for weakness and light-headedness.  Hematological: Negative for adenopathy.  Psychiatric/Behavioral: Negative for behavioral problems.   Past Medical History  Diagnosis Date  . HYPERLIPIDEMIA 08/26/2006  . HYPERTENSION 08/26/2006  . PROSTATE SPECIFIC ANTIGEN, ELEVATED 06/24/2006    History   Social History  . Marital Status: Married    Spouse Name: N/A    Number of Children: N/A  . Years of Education: N/A   Occupational History  . retired    Social History Main Topics  . Smoking status: Never Smoker   . Smokeless tobacco: Not on file  . Alcohol Use: No  .  Drug Use: No  . Sexually Active: Yes   Other Topics Concern  . Not on file   Social History Narrative  . No narrative on file    Past Surgical History  Procedure Date  . Tonsillectomy   . Prostate biopsy   . Hernia repair     Family History  Problem Relation Age of Onset  . Stroke Mother   . COPD Father     Allergies  Allergen Reactions  . Penicillins     Current Outpatient Prescriptions on File Prior to Visit  Medication Sig Dispense Refill  . amLODipine-olmesartan (AZOR) 10-40 MG per tablet Take 1 tablet by mouth daily.      Marland Kitchen BYSTOLIC 20 MG TABS TAKE ONE TABLET BY MOUTH EVERY DAY  30 each  0  . meloxicam (MOBIC) 15 MG tablet TAKE ONE TABLET BY MOUTH EVERY DAY AS NEEDED  30 tablet  6  . Multiple Vitamin (MULTIVITAMIN) capsule Take 1 capsule by mouth daily.        . rosuvastatin (CRESTOR) 10 MG tablet Take 1 tablet (10 mg total) by mouth daily.  30 tablet  11  . traMADol (ULTRAM) 50 MG tablet TAKE ONE TABLET BY MOUTH EVERY 6 HOURS AS NEEDED FOR PAIN WITH TYLENOL 500MG   50 tablet  3    BP 146/80  Pulse 76  Temp 98.6 F (37 C)  Resp 16  Ht 5\' 6"  (1.676 m)  Wt 160 lb (72.576 kg)  BMI 25.82 kg/m2       Objective:   Physical Exam  Nursing note and vitals reviewed. Constitutional:  He appears well-developed and well-nourished.  HENT:  Head: Normocephalic and atraumatic.  Eyes: Conjunctivae are normal. Pupils are equal, round, and reactive to light.  Neck: Normal range of motion. Neck supple.  Cardiovascular: Normal rate and regular rhythm.   Pulmonary/Chest: Effort normal and breath sounds normal.  Abdominal: Soft. Bowel sounds are normal.  Musculoskeletal: He exhibits tenderness.          Assessment & Plan:  Cautioned about use of nonsteroidals as they may affect his blood pressure and given a prescription for Ultram 50 mg by mouth up to 3-4 times a day for arthritic pain.  Specifically he was cautioned not to take Advil or Aleve cause of their affect  on his blood pressure and his risk for renal disease as well as ulcer disease.  Samples of Crestor were given to aid in his compliance samples of diastolic were also given as well as samples of Azor. His blood pressure appears to be reasonably stable on his current regimen. He is on Lasix 40 mg by mouth daily and a basic metabolic panel should be monitored to see that his renal insufficiency is stable and his potassium is in range.

## 2011-04-10 ENCOUNTER — Other Ambulatory Visit: Payer: Self-pay | Admitting: Internal Medicine

## 2011-04-10 ENCOUNTER — Encounter: Payer: Self-pay | Admitting: Internal Medicine

## 2011-04-18 ENCOUNTER — Other Ambulatory Visit: Payer: Self-pay | Admitting: Internal Medicine

## 2011-05-07 ENCOUNTER — Other Ambulatory Visit: Payer: Self-pay | Admitting: Internal Medicine

## 2011-05-20 ENCOUNTER — Other Ambulatory Visit: Payer: Self-pay | Admitting: Internal Medicine

## 2011-05-29 ENCOUNTER — Ambulatory Visit: Payer: Medicare Other | Admitting: Internal Medicine

## 2011-05-29 ENCOUNTER — Encounter: Payer: Self-pay | Admitting: Internal Medicine

## 2011-05-29 ENCOUNTER — Ambulatory Visit (INDEPENDENT_AMBULATORY_CARE_PROVIDER_SITE_OTHER): Payer: Medicare Other | Admitting: Internal Medicine

## 2011-05-29 VITALS — BP 136/80 | HR 72 | Temp 98.2°F | Resp 16 | Ht 66.0 in | Wt 162.0 lb

## 2011-05-29 DIAGNOSIS — I10 Essential (primary) hypertension: Secondary | ICD-10-CM | POA: Diagnosis not present

## 2011-05-29 DIAGNOSIS — M1990 Unspecified osteoarthritis, unspecified site: Secondary | ICD-10-CM | POA: Diagnosis not present

## 2011-05-29 DIAGNOSIS — T887XXA Unspecified adverse effect of drug or medicament, initial encounter: Secondary | ICD-10-CM | POA: Diagnosis not present

## 2011-05-29 NOTE — Progress Notes (Signed)
Subjective:    Patient ID: John Hutchinson, male    DOB: 1934/01/10, 78 y.o.   MRN: [id number]  HPI Pain control inadequate for OA Blood pressure stable Monitoring for hyperlipidemia on Crestor   Review of Systems  Constitutional: Negative for fever and fatigue.  HENT: Negative for hearing loss, congestion, neck pain and postnasal drip.   Eyes: Negative for discharge, redness and visual disturbance.  Respiratory: Negative for cough, shortness of breath and wheezing.   Cardiovascular: Negative for leg swelling.  Gastrointestinal: Negative for abdominal pain, constipation and abdominal distention.  Genitourinary: Negative for urgency and frequency.  Musculoskeletal: Negative for joint swelling and arthralgias.  Skin: Negative for color change and rash.  Neurological: Negative for weakness and light-headedness.  Hematological: Negative for adenopathy.  Psychiatric/Behavioral: Negative for behavioral problems.   Past Medical History  Diagnosis Date  . HYPERLIPIDEMIA 08/26/2006  . HYPERTENSION 08/26/2006  . PROSTATE SPECIFIC ANTIGEN, ELEVATED 06/24/2006    History   Social History  . Marital Status: Married    Spouse Name: N/A    Number of Children: N/A  . Years of Education: N/A   Occupational History  . retired    Social History Main Topics  . Smoking status: Never Smoker   . Smokeless tobacco: Not on file  . Alcohol Use: No  . Drug Use: No  . Sexually Active: Yes   Other Topics Concern  . Not on file   Social History Narrative  . No narrative on file    Past Surgical History  Procedure Date  . Tonsillectomy   . Prostate biopsy   . Hernia repair     Family History  Problem Relation Age of Onset  . Stroke Mother   . COPD Father     Allergies  Allergen Reactions  . Penicillins     Current Outpatient Prescriptions on File Prior to Visit  Medication Sig Dispense Refill  . amLODipine-olmesartan (AZOR) 10-40 MG per tablet Take 1 tablet by mouth daily.       Marland Kitchen BYSTOLIC 20 MG TABS TAKE ONE TABLET BY MOUTH EVERY DAY  30 each  0  . furosemide (LASIX) 40 MG tablet TAKE ONE TABLET BY MOUTH EVERY DAY  30 tablet  6  . meloxicam (MOBIC) 15 MG tablet TAKE ONE TABLET BY MOUTH EVERY DAY AS NEEDED  30 tablet  6  . Multiple Vitamin (MULTIVITAMIN) capsule Take 1 capsule by mouth daily.        . rosuvastatin (CRESTOR) 10 MG tablet Take 1 tablet (10 mg total) by mouth daily.  30 tablet  11  . traMADol (ULTRAM) 50 MG tablet TAKE ONE TABLET BY MOUTH EVERY 6 HOURS AS NEEDED FOR PAIN WITH TYLENOL 500MG   90 tablet  3    BP 136/80  Pulse 72  Temp 98.2 F (36.8 C)  Resp 16  Ht 5\' 6"  (1.676 m)  Wt 162 lb (73.483 kg)  BMI 26.15 kg/m2       Objective:   Physical Exam  Constitutional: He appears well-developed and well-nourished.  HENT:  Head: Normocephalic and atraumatic.  Eyes: Conjunctivae are normal. Pupils are equal, round, and reactive to light.  Neck: Normal range of motion. Neck supple.  Cardiovascular: Normal rate and regular rhythm.   Pulmonary/Chest: Effort normal and breath sounds normal.  Abdominal: Soft. Bowel sounds are normal.          Assessment & Plan:  Taking one tramadol in the evening for OA pain Stable blood pressure with mild  peripheral edema on Lasix and Azor as well as bybystollic No evidence of CHF. Stable without chest pain Continued Crestor for hyperlipidemia Discussed at his age no longer following PSA

## 2011-05-29 NOTE — Patient Instructions (Signed)
The patient is instructed to continue all medications as prescribed. Schedule followup with check out clerk upon leaving the clinic  

## 2011-07-16 ENCOUNTER — Other Ambulatory Visit: Payer: Self-pay | Admitting: *Deleted

## 2011-07-16 ENCOUNTER — Telehealth: Payer: Self-pay | Admitting: Internal Medicine

## 2011-07-16 DIAGNOSIS — I10 Essential (primary) hypertension: Secondary | ICD-10-CM

## 2011-07-16 MED ORDER — AMLODIPINE-OLMESARTAN 10-40 MG PO TABS: 1.0000 | ORAL_TABLET | Freq: Every day | ORAL | Status: DC

## 2011-07-16 NOTE — Telephone Encounter (Signed)
Pt is on samples of Azor 10-40mg  and is currently out and is requesting more samples of for a rx to be called in to pharmacy  [facility] [address] [place]

## 2011-07-16 NOTE — Telephone Encounter (Signed)
Sent to pharmacy and pt informed 

## 2011-09-30 ENCOUNTER — Ambulatory Visit: Payer: Medicare Other | Admitting: Internal Medicine

## 2011-10-01 ENCOUNTER — Other Ambulatory Visit: Payer: Self-pay | Admitting: Internal Medicine

## 2011-10-10 ENCOUNTER — Encounter: Payer: Self-pay | Admitting: Internal Medicine

## 2011-10-10 ENCOUNTER — Ambulatory Visit (INDEPENDENT_AMBULATORY_CARE_PROVIDER_SITE_OTHER): Payer: Medicare Other | Admitting: Internal Medicine

## 2011-10-10 VITALS — BP 144/78 | HR 84 | Temp 98.2°F | Resp 16 | Ht 66.0 in | Wt 166.0 lb

## 2011-10-10 DIAGNOSIS — I10 Essential (primary) hypertension: Secondary | ICD-10-CM | POA: Diagnosis not present

## 2011-10-10 NOTE — Patient Instructions (Signed)
Samples of medication given to patient  The patient is instructed to continue all medications as prescribed. Schedule followup with check out clerk upon leaving the clinic

## 2011-10-10 NOTE — Progress Notes (Signed)
  Subjective:    Patient ID: John Hutchinson, male    DOB: 08-02-1933, 78 y.o.   MRN: [id number]  HPI Patient is a 78 year old male followed for hypertension hyperlipidemia and a history of osteoarthritis he presents today with an elevated blood pressure of 162/72   Review of Systems  Constitutional: Negative for fever and fatigue.  HENT: Negative for hearing loss, congestion, neck pain and postnasal drip.   Eyes: Negative for discharge, redness and visual disturbance.  Respiratory: Negative for cough, shortness of breath and wheezing.   Cardiovascular: Negative for leg swelling.  Gastrointestinal: Negative for abdominal pain, constipation and abdominal distention.  Genitourinary: Negative for urgency and frequency.  Musculoskeletal: Negative for joint swelling and arthralgias.  Skin: Negative for color change and rash.  Neurological: Negative for weakness and light-headedness.  Hematological: Negative for adenopathy.  Psychiatric/Behavioral: Negative for behavioral problems.       Objective:   Physical Exam  Nursing note and vitals reviewed. Constitutional: He appears well-developed and well-nourished.  HENT:  Head: Normocephalic and atraumatic.  Eyes: Conjunctivae normal are normal. Pupils are equal, round, and reactive to light.  Neck: Normal range of motion. Neck supple.  Cardiovascular: Normal rate and regular rhythm.   Murmur heard. Pulmonary/Chest: Effort normal and breath sounds normal.  Abdominal: Soft. Bowel sounds are normal.          Assessment & Plan:  He presents today for recheck of his blood pressure his blood pressure is well controlled on current regimen of Azor by systolic and Lasix.  He is out of his Crestor and samples of Crestor were given to aid in compliance

## 2011-11-05 ENCOUNTER — Other Ambulatory Visit: Payer: Self-pay | Admitting: Internal Medicine

## 2011-12-16 ENCOUNTER — Other Ambulatory Visit: Payer: Self-pay | Admitting: Internal Medicine

## 2011-12-31 ENCOUNTER — Other Ambulatory Visit: Payer: Self-pay | Admitting: Internal Medicine

## 2012-01-13 ENCOUNTER — Ambulatory Visit (INDEPENDENT_AMBULATORY_CARE_PROVIDER_SITE_OTHER): Payer: Medicare Other | Admitting: Internal Medicine

## 2012-01-13 ENCOUNTER — Encounter: Payer: Self-pay | Admitting: Internal Medicine

## 2012-01-13 VITALS — BP 140/80 | HR 80 | Temp 98.2°F | Resp 16 | Ht 66.0 in | Wt 164.0 lb

## 2012-01-13 DIAGNOSIS — I10 Essential (primary) hypertension: Secondary | ICD-10-CM | POA: Diagnosis not present

## 2012-01-13 DIAGNOSIS — M1990 Unspecified osteoarthritis, unspecified site: Secondary | ICD-10-CM | POA: Diagnosis not present

## 2012-01-13 MED ORDER — MELOXICAM 15 MG PO TABS: 15.0000 mg | ORAL_TABLET | Freq: Every day | ORAL | Status: DC

## 2012-01-13 NOTE — Patient Instructions (Signed)
The patient is instructed to continue all medications as prescribed. Schedule followup with check out clerk upon leaving the clinic  

## 2012-01-13 NOTE — Progress Notes (Signed)
  Subjective:    Patient ID: John Creger., male    DOB: 01-11-1934, 78 y.o.   MRN: [id number]  HPI Patient presents for followup of hypertension he is on Azor 10/40 and BYastolic 20.  His blood pressure is well-controlled He has osteoarthritis and requires a refill of his meloxicam.  He walks with a cane sometimes for safety he has not had falls   Review of Systems  Constitutional: Negative for fever and fatigue.  HENT: Negative for hearing loss, congestion, neck pain and postnasal drip.   Eyes: Negative for discharge, redness and visual disturbance.  Respiratory: Negative for cough, shortness of breath and wheezing.   Cardiovascular: Negative for leg swelling.  Gastrointestinal: Negative for abdominal pain, constipation and abdominal distention.  Genitourinary: Negative for urgency and frequency.  Musculoskeletal: Positive for myalgias, joint swelling, arthralgias and gait problem.  Skin: Negative for color change and rash.  Neurological: Negative for weakness and light-headedness.  Hematological: Negative for adenopathy.  Psychiatric/Behavioral: Negative for behavioral problems.       Objective:   Physical Exam  Nursing note and vitals reviewed. Constitutional: He appears well-developed and well-nourished.  HENT:  Head: Normocephalic and atraumatic.  Eyes: Conjunctivae normal are normal. Pupils are equal, round, and reactive to light.  Neck: Normal range of motion. Neck supple.  Cardiovascular: Normal rate and regular rhythm.   Pulmonary/Chest: Effort normal and breath sounds normal.  Abdominal: Soft. Bowel sounds are normal.          Assessment & Plan:  Stable on current medications samples of medications given to aid in compliance prescription for meloxicam sent to pharmacy [facility]

## 2012-04-28 ENCOUNTER — Other Ambulatory Visit: Payer: Self-pay | Admitting: Internal Medicine

## 2012-05-15 ENCOUNTER — Ambulatory Visit: Payer: Medicare Other | Admitting: Internal Medicine

## 2012-05-18 ENCOUNTER — Ambulatory Visit (INDEPENDENT_AMBULATORY_CARE_PROVIDER_SITE_OTHER): Payer: Medicare Other | Admitting: Internal Medicine

## 2012-05-18 ENCOUNTER — Encounter: Payer: Self-pay | Admitting: Internal Medicine

## 2012-05-18 VITALS — BP 170/70 | HR 72 | Temp 98.2°F | Resp 16 | Ht 66.0 in | Wt 168.0 lb

## 2012-05-18 DIAGNOSIS — I10 Essential (primary) hypertension: Secondary | ICD-10-CM

## 2012-05-18 MED ORDER — FUROSEMIDE 40 MG PO TABS: 60.0000 mg | ORAL_TABLET | Freq: Every day | ORAL | Status: DC

## 2012-05-18 NOTE — Patient Instructions (Addendum)
Now taking 1-1/2 furosemide 40 mg tablets

## 2012-05-18 NOTE — Progress Notes (Signed)
Subjective:    Patient ID: John Hutchinson., male    DOB: 01-09-1934, 79 y.o.   MRN: [id number]  HPI  Increased blood pressure without chest pain or SOB but moderate edema  Review of Systems  Constitutional: Positive for fatigue. Negative for fever.  HENT: Negative for hearing loss, congestion, neck pain and postnasal drip.   Eyes: Negative for discharge, redness and visual disturbance.  Respiratory: Positive for shortness of breath. Negative for cough and wheezing.   Cardiovascular: Positive for leg swelling.  Gastrointestinal: Negative for abdominal pain, constipation and abdominal distention.  Genitourinary: Negative for urgency and frequency.  Musculoskeletal: Negative for joint swelling and arthralgias.  Skin: Negative for color change and rash.  Neurological: Negative for weakness and light-headedness.  Hematological: Negative for adenopathy.  Psychiatric/Behavioral: Negative for behavioral problems.   Past Medical History  Diagnosis Date  . HYPERLIPIDEMIA 08/26/2006  . HYPERTENSION 08/26/2006  . PROSTATE SPECIFIC ANTIGEN, ELEVATED 06/24/2006    History   Social History  . Marital Status: Married    Spouse Name: N/A    Number of Children: N/A  . Years of Education: N/A   Occupational History  . retired    Social History Main Topics  . Smoking status: Never Smoker   . Smokeless tobacco: Not on file  . Alcohol Use: No  . Drug Use: No  . Sexually Active: Yes   Other Topics Concern  . Not on file   Social History Narrative  . No narrative on file    Past Surgical History  Procedure Laterality Date  . Tonsillectomy    . Prostate biopsy    . Hernia repair      Family History  Problem Relation Age of Onset  . Stroke Mother   . COPD Father     Allergies  Allergen Reactions  . Penicillins     Current Outpatient Prescriptions on File Prior to Visit  Medication Sig Dispense Refill  . BYSTOLIC 20 MG TABS TAKE ONE TABLET BY MOUTH EVERY DAY  30 tablet   11  . Multiple Vitamin (MULTIVITAMIN) capsule Take 1 capsule by mouth daily.        . rosuvastatin (CRESTOR) 10 MG tablet Take 1 tablet (10 mg total) by mouth daily.  30 tablet  11  . traMADol (ULTRAM) 50 MG tablet TAKE ONE TABLET BY MOUTH EVERY 6 HOURS AS NEEDED FOR PAIN WITH TYLENOL 500MG   90 tablet  2  . AZOR 10-40 MG per tablet TAKE ONE TABLET BY MOUTH EVERY DAY  30 tablet  0  . meloxicam (MOBIC) 15 MG tablet Take 1 tablet (15 mg total) by mouth daily.  30 tablet  11   No current facility-administered medications on file prior to visit.    BP 170/70  Pulse 72  Temp(Src) 98.2 F (36.8 C)  Resp 16  Ht 5\' 6"  (1.676 m)  Wt 168 lb (76.204 kg)  BMI 27.13 kg/m2       Objective:   Physical Exam  Nursing note and vitals reviewed. Constitutional: He appears well-developed and well-nourished.  HENT:  Head: Normocephalic and atraumatic.  Eyes: Conjunctivae are normal. Pupils are equal, round, and reactive to light.  Neck: Normal range of motion. Neck supple.  Cardiovascular: Normal rate and regular rhythm.   Murmur heard. Pulmonary/Chest: Effort normal and breath sounds normal.  Abdominal: Soft. Bowel sounds are normal.  Musculoskeletal: He exhibits edema.          Assessment & Plan:  Increased lasix to  60 mg and monitor pressure Monitor bmet today If blood pressure remains high consider RAS vs progressive renal dz

## 2012-05-19 LAB — BASIC METABOLIC PANEL
Chloride: 105 mEq/L (ref 96–112)
GFR: 65.3 mL/min (ref >60.00)
Potassium: 4.1 mEq/L (ref 3.5–5.1)
Sodium: 142 mEq/L (ref 135–145)

## 2012-05-25 ENCOUNTER — Other Ambulatory Visit: Payer: Self-pay | Admitting: Internal Medicine

## 2012-06-16 ENCOUNTER — Other Ambulatory Visit: Payer: Self-pay | Admitting: Internal Medicine

## 2012-07-09 ENCOUNTER — Telehealth: Payer: Self-pay | Admitting: Internal Medicine

## 2012-07-09 MED ORDER — TRAMADOL HCL 50 MG PO TABS: ORAL_TABLET | ORAL | Status: DC

## 2012-07-09 MED ORDER — AMLODIPINE-OLMESARTAN 10-40 MG PO TABS: ORAL_TABLET | ORAL | Status: DC

## 2012-07-09 MED ORDER — ROSUVASTATIN CALCIUM 10 MG PO TABS: 10.0000 mg | ORAL_TABLET | Freq: Every day | ORAL | Status: DC

## 2012-07-09 NOTE — Telephone Encounter (Signed)
done

## 2012-07-09 NOTE — Telephone Encounter (Signed)
Pt needs new rx crestor 10 mg#30 with refills pt had samples. Pt also needs refill on azor 10-40 mg and tramadol 50mg  pt will take with tylenol 500mg   call into [facility] [phone]

## 2012-09-28 ENCOUNTER — Ambulatory Visit (INDEPENDENT_AMBULATORY_CARE_PROVIDER_SITE_OTHER): Payer: Medicare Other | Admitting: Internal Medicine

## 2012-09-28 ENCOUNTER — Encounter: Payer: Self-pay | Admitting: Internal Medicine

## 2012-09-28 VITALS — BP 155/88 | HR 76 | Temp 98.2°F | Resp 16 | Ht 66.0 in | Wt 170.0 lb

## 2012-09-28 DIAGNOSIS — M1990 Unspecified osteoarthritis, unspecified site: Secondary | ICD-10-CM

## 2012-09-28 DIAGNOSIS — I10 Essential (primary) hypertension: Secondary | ICD-10-CM | POA: Diagnosis not present

## 2012-09-28 DIAGNOSIS — E785 Hyperlipidemia, unspecified: Secondary | ICD-10-CM

## 2012-09-28 DIAGNOSIS — N138 Other obstructive and reflux uropathy: Secondary | ICD-10-CM

## 2012-09-28 DIAGNOSIS — N401 Enlarged prostate with lower urinary tract symptoms: Secondary | ICD-10-CM | POA: Diagnosis not present

## 2012-09-28 MED ORDER — TERAZOSIN HCL 2 MG PO CAPS: 2.0000 mg | ORAL_CAPSULE | Freq: Every day | ORAL | Status: DC

## 2012-09-28 NOTE — Progress Notes (Signed)
Subjective:    Patient ID: John Enriques., male    DOB: Nov 09, 1933, 79 y.o.   MRN: [id number]  HPI  This is a 79 year old male who is followed for hypertension and for her osteoarthritis as well as a history of hyperlipidemia.  He is on blood pressure medications and medications for his cholesterol.  He presents today for routine followup states that he is doing well in fact he is walking better with and without his cane  Review of Systems  Constitutional: Negative for fever and fatigue.  HENT: Positive for postnasal drip. Negative for hearing loss, congestion and neck pain.   Eyes: Negative for discharge, redness and visual disturbance.  Respiratory: Negative for cough, shortness of breath and wheezing.   Cardiovascular: Negative for leg swelling.  Gastrointestinal: Negative for abdominal pain, constipation and abdominal distention.  Genitourinary: Negative for urgency and frequency.  Musculoskeletal: Positive for joint swelling and gait problem. Negative for arthralgias.  Skin: Negative for color change and rash.  Neurological: Negative for weakness and light-headedness.  Hematological: Negative for adenopathy.  Psychiatric/Behavioral: Negative for behavioral problems.   Past Medical History  Diagnosis Date  . HYPERLIPIDEMIA 08/26/2006  . HYPERTENSION 08/26/2006  . PROSTATE SPECIFIC ANTIGEN, ELEVATED 06/24/2006    History   Social History  . Marital Status: Married    Spouse Name: N/A    Number of Children: N/A  . Years of Education: N/A   Occupational History  . retired    Social History Main Topics  . Smoking status: Never Smoker   . Smokeless tobacco: Not on file  . Alcohol Use: No  . Drug Use: No  . Sexual Activity: Yes   Other Topics Concern  . Not on file   Social History Narrative  . No narrative on file    Past Surgical History  Procedure Laterality Date  . Tonsillectomy    . Prostate biopsy    . Hernia repair      Family History  Problem  Relation Age of Onset  . Stroke Mother   . COPD Father     Allergies  Allergen Reactions  . Penicillins     Current Outpatient Prescriptions on File Prior to Visit  Medication Sig Dispense Refill  . amLODipine-olmesartan (AZOR) 10-40 MG per tablet TAKE ONE TABLET BY MOUTH ONCE DAILY  30 tablet  11  . BYSTOLIC 20 MG TABS TAKE ONE TABLET BY MOUTH EVERY DAY  30 tablet  11  . furosemide (LASIX) 40 MG tablet Take 1.5 tablets (60 mg total) by mouth daily.  60 tablet  11  . meloxicam (MOBIC) 15 MG tablet Take 1 tablet (15 mg total) by mouth daily.  30 tablet  11  . Multiple Vitamin (MULTIVITAMIN) capsule Take 1 capsule by mouth daily.        . rosuvastatin (CRESTOR) 10 MG tablet Take 1 tablet (10 mg total) by mouth daily.  30 tablet  11  . traMADol (ULTRAM) 50 MG tablet TAKE ONE TABLET BY MOUTH EVERY 6 HOURS AS NEEDED FOR PAIN. TAKE WITH TYLENOL 500MG   90 tablet  3   No current facility-administered medications on file prior to visit.    BP 155/88  Pulse 76  Temp(Src) 98.2 F (36.8 C)  Resp 16  Ht 5\' 6"  (1.676 m)  Wt 170 lb (77.111 kg)  BMI 27.45 kg/m2       Objective:   Physical Exam  Constitutional: He appears well-developed and well-nourished.  HENT:  Head: Normocephalic and atraumatic.  Eyes: Conjunctivae are normal. Pupils are equal, round, and reactive to light.  Neck: Normal range of motion. Neck supple.  Cardiovascular: Normal rate and regular rhythm.   Murmur heard. Pulmonary/Chest: Effort normal and breath sounds normal.  Abdominal: Soft. Bowel sounds are normal.          Assessment & Plan:

## 2012-09-28 NOTE — Patient Instructions (Addendum)
The patient is instructed to continue all medications as prescribed. Schedule followup with check out clerk upon leaving the clinic  

## 2012-10-26 ENCOUNTER — Other Ambulatory Visit: Payer: Self-pay | Admitting: Internal Medicine

## 2012-12-07 ENCOUNTER — Telehealth: Payer: Self-pay | Admitting: Internal Medicine

## 2012-12-07 NOTE — Telephone Encounter (Signed)
Pt needs samples of bystolic 20 mg. Phar is still waiting for med to come in. Pt wife will be here at 2pm.

## 2012-12-07 NOTE — Telephone Encounter (Signed)
Out front for pick up

## 2012-12-30 ENCOUNTER — Telehealth: Payer: Self-pay | Admitting: *Deleted

## 2012-12-30 ENCOUNTER — Telehealth: Payer: Self-pay | Admitting: Internal Medicine

## 2012-12-30 NOTE — Telephone Encounter (Signed)
None available. Talked with [facility] and   They state they stopped manufacturing the 20.  Will ask dr Lovell Sheehan on Friday for a change

## 2012-12-30 NOTE — Telephone Encounter (Signed)
Pt would like samples of bystolic 20 mg or 10 mg enough for 11 days. Per pharm bystolic 20 mg is not longer been manufacture.

## 2013-01-01 NOTE — Telephone Encounter (Signed)
Io ly had 5mg - gave 5 bottle can take 4 per day

## 2013-01-01 NOTE — Telephone Encounter (Signed)
Pt instructed to call back today. Pt would like samples of bystolic 20 mg to get him through until [place]. [place].  In jan, pt would like to switch to a different med (generic) b/c the pharm continually does not have the bystolic 20 mg. Pt states the pharm has been out of the 20 mg 3 x this past year. pls advise.

## 2013-01-08 ENCOUNTER — Other Ambulatory Visit: Payer: Self-pay | Admitting: Internal Medicine

## 2013-01-11 ENCOUNTER — Telehealth: Payer: Self-pay | Admitting: Internal Medicine

## 2013-01-11 ENCOUNTER — Other Ambulatory Visit: Payer: Self-pay | Admitting: *Deleted

## 2013-01-11 MED ORDER — CARVEDILOL 25 MG PO TABS: 25.0000 mg | ORAL_TABLET | Freq: Two times a day (BID) | ORAL | Status: DC

## 2013-01-11 NOTE — Telephone Encounter (Signed)
Per dr Lovell Sheehan- ok to send in coreg 25 bid

## 2013-01-11 NOTE — Telephone Encounter (Signed)
Pt was given samples of bystolic 20mg , but they do not make anymore. Pt assumed a rx for coreg 25mg  (bid) was to be sent to pharm when samples ran out. Pt is out and would like the rx sent to [facility]/ [address] in [place]

## 2013-01-15 ENCOUNTER — Telehealth: Payer: Self-pay | Admitting: Internal Medicine

## 2013-01-15 NOTE — Telephone Encounter (Signed)
Pt informed to call pharmacy and get name of bp meds on formulary

## 2013-01-15 NOTE — Telephone Encounter (Signed)
Pt requesting an alternative medication for amLODipine-olmesartan (AZOR) 10-40 MG per tablet because this medication is no on his drug formulary.  Please sent rx to [id number] - [place] [address] [place] [address]

## 2013-01-15 NOTE — Telephone Encounter (Signed)
Pt states Benicar and Amlodipine are on his formulary.  Pt states he was also on both of these medications previously.

## 2013-01-18 ENCOUNTER — Other Ambulatory Visit: Payer: Self-pay | Admitting: *Deleted

## 2013-01-18 MED ORDER — OLMESARTAN MEDOXOMIL 40 MG PO TABS: 40.0000 mg | ORAL_TABLET | Freq: Every day | ORAL | Status: DC

## 2013-01-18 MED ORDER — AMLODIPINE BESYLATE 10 MG PO TABS: 10.0000 mg | ORAL_TABLET | Freq: Every day | ORAL | Status: DC

## 2013-01-18 NOTE — Telephone Encounter (Signed)
Done

## 2013-01-18 NOTE — Telephone Encounter (Signed)
Ok to split the azor into two drugs

## 2013-02-08 ENCOUNTER — Other Ambulatory Visit: Payer: Self-pay | Admitting: Internal Medicine

## 2013-02-17 ENCOUNTER — Other Ambulatory Visit: Payer: Self-pay | Admitting: Internal Medicine

## 2013-02-26 ENCOUNTER — Encounter: Payer: Self-pay | Admitting: *Deleted

## 2013-03-01 ENCOUNTER — Encounter: Payer: Self-pay | Admitting: Internal Medicine

## 2013-03-01 ENCOUNTER — Ambulatory Visit (INDEPENDENT_AMBULATORY_CARE_PROVIDER_SITE_OTHER): Payer: Medicare Other | Admitting: Internal Medicine

## 2013-03-01 VITALS — BP 144/86 | HR 76 | Temp 98.2°F | Resp 16 | Ht 66.0 in | Wt 174.0 lb

## 2013-03-01 DIAGNOSIS — T887XXA Unspecified adverse effect of drug or medicament, initial encounter: Secondary | ICD-10-CM | POA: Diagnosis not present

## 2013-03-01 DIAGNOSIS — I10 Essential (primary) hypertension: Secondary | ICD-10-CM | POA: Diagnosis not present

## 2013-03-01 DIAGNOSIS — E785 Hyperlipidemia, unspecified: Secondary | ICD-10-CM

## 2013-03-01 DIAGNOSIS — M1990 Unspecified osteoarthritis, unspecified site: Secondary | ICD-10-CM

## 2013-03-01 DIAGNOSIS — Z23 Encounter for immunization: Secondary | ICD-10-CM

## 2013-03-01 DIAGNOSIS — Z0000 Encounter for general adult medical examination without abnormal findings: Secondary | ICD-10-CM | POA: Diagnosis not present

## 2013-03-01 DIAGNOSIS — Z79899 Other long term (current) drug therapy: Secondary | ICD-10-CM | POA: Diagnosis not present

## 2013-03-01 LAB — CBC WITH DIFFERENTIAL/PLATELET
BASOS ABS: 0 10*3/uL (ref 0.0–0.1)
Basophils Relative: 0.5 % (ref 0.0–3.0)
EOS ABS: 0.1 10*3/uL (ref 0.0–0.7)
Eosinophils Relative: 2.1 % (ref 0.0–5.0)
HCT: 43.4 % (ref 39.0–52.0)
Hemoglobin: 13.8 g/dL (ref 13.0–17.0)
Lymphocytes Relative: 22.4 % (ref 12.0–46.0)
Lymphs Abs: 1.2 10*3/uL (ref 0.7–4.0)
MCHC: 31.8 g/dL (ref 30.0–36.0)
MCV: 94.7 fl (ref 78.0–100.0)
MONO ABS: 0.4 10*3/uL (ref 0.1–1.0)
Monocytes Relative: 7.4 % (ref 3.0–12.0)
NEUTROS PCT: 67.6 % (ref 43.0–77.0)
Neutro Abs: 3.5 10*3/uL (ref 1.4–7.7)
Platelets: 250 10*3/uL (ref 150.0–400.0)
RBC: 4.59 Mil/uL (ref 4.22–5.81)
RDW: 13.3 % (ref 11.5–14.6)
WBC: 5.2 10*3/uL (ref 4.5–10.5)

## 2013-03-01 LAB — LIPID PANEL
CHOL/HDL RATIO: 3
Cholesterol: 157 mg/dL (ref 0–200)
HDL: 50 mg/dL (ref >39.00)
LDL CALC: 90 mg/dL (ref 0–99)
Triglycerides: 87 mg/dL (ref 0.0–149.0)
VLDL: 17.4 mg/dL (ref 0.0–40.0)

## 2013-03-01 LAB — HEPATIC FUNCTION PANEL
ALK PHOS: 71 U/L (ref 39–117)
ALT: 15 U/L (ref 0–53)
AST: 19 U/L (ref 0–37)
Albumin: 4 g/dL (ref 3.5–5.2)
BILIRUBIN TOTAL: 0.7 mg/dL (ref 0.3–1.2)
Bilirubin, Direct: 0.1 mg/dL (ref 0.0–0.3)
Total Protein: 6.8 g/dL (ref 6.0–8.3)

## 2013-03-01 LAB — BASIC METABOLIC PANEL
BUN: 14 mg/dL (ref 6–23)
CHLORIDE: 106 meq/L (ref 96–112)
CO2: 28 mEq/L (ref 19–32)
Calcium: 8.9 mg/dL (ref 8.4–10.5)
Creatinine, Ser: 1.2 mg/dL (ref 0.4–1.5)
GFR: 76.02 mL/min (ref >60.00)
Glucose, Bld: 80 mg/dL (ref 70–99)
POTASSIUM: 3.9 meq/L (ref 3.5–5.1)
Sodium: 145 mEq/L (ref 135–145)

## 2013-03-01 LAB — TSH: TSH: 2.69 u[IU]/mL (ref 0.35–5.50)

## 2013-03-01 NOTE — Patient Instructions (Signed)
The patient is instructed to continue all medications as prescribed. Schedule followup with check out clerk upon leaving the clinic  

## 2013-03-01 NOTE — Progress Notes (Signed)
Pre visit review using our clinic review tool, if applicable. No additional management support is needed unless otherwise documented below in the visit note. 

## 2013-03-01 NOTE — Progress Notes (Signed)
Subjective:    Patient ID: John Yanagi., male    DOB: 10-18-1933, 80 y.o.   MRN: [id number]  HPI  Did not take medications today due to fasting state Blood pressure is up but no symptoms and at home when he is adherent be reports good readings  Review of Systems  Constitutional: Negative for fever and fatigue.  HENT: Positive for rhinorrhea. Negative for congestion, hearing loss and postnasal drip.   Eyes: Negative for discharge, redness and visual disturbance.  Respiratory: Negative for cough, shortness of breath and wheezing.   Cardiovascular: Positive for leg swelling.  Gastrointestinal: Negative for abdominal pain, constipation and abdominal distention.  Genitourinary: Positive for frequency. Negative for urgency.  Musculoskeletal: Negative for arthralgias, joint swelling and neck pain.  Skin: Negative for color change and rash.  Neurological: Negative for weakness and light-headedness.  Hematological: Negative for adenopathy.  Psychiatric/Behavioral: Negative for behavioral problems.   Past Medical History  Diagnosis Date  . HYPERLIPIDEMIA 08/26/2006  . HYPERTENSION 08/26/2006  . PROSTATE SPECIFIC ANTIGEN, ELEVATED 06/24/2006    History   Social History  . Marital Status: Married    Spouse Name: N/A    Number of Children: N/A  . Years of Education: N/A   Occupational History  . retired    Social History Main Topics  . Smoking status: Never Smoker   . Smokeless tobacco: Not on file  . Alcohol Use: No  . Drug Use: No  . Sexual Activity: Yes   Other Topics Concern  . Not on file   Social History Narrative  . No narrative on file    Past Surgical History  Procedure Laterality Date  . Tonsillectomy    . Prostate biopsy    . Hernia repair      Family History  Problem Relation Age of Onset  . Stroke Mother   . COPD Father     Allergies  Allergen Reactions  . Penicillins     Current Outpatient Prescriptions on File Prior to Visit  Medication  Sig Dispense Refill  . amLODipine (NORVASC) 10 MG tablet Take 1 tablet (10 mg total) by mouth daily.  90 tablet  3  . carvedilol (COREG) 25 MG tablet Take 1 tablet (25 mg total) by mouth 2 (two) times daily with a meal.  60 tablet  6  . furosemide (LASIX) 40 MG tablet Take 1.5 tablets (60 mg total) by mouth daily.  60 tablet  11  . meloxicam (MOBIC) 15 MG tablet TAKE ONE TABLET BY MOUTH ONCE DAILY  30 tablet  6  . Multiple Vitamin (MULTIVITAMIN) capsule Take 1 capsule by mouth daily.        Marland Kitchen olmesartan (BENICAR) 40 MG tablet Take 1 tablet (40 mg total) by mouth daily.  90 tablet  3  . rosuvastatin (CRESTOR) 10 MG tablet Take 1 tablet (10 mg total) by mouth daily.  30 tablet  11  . terazosin (HYTRIN) 2 MG capsule Take 1 capsule (2 mg total) by mouth at bedtime. For BPH  30 capsule  11  . traMADol (ULTRAM) 50 MG tablet TAKE ONE TABLET BY MOUTH EVERY 6 HOURS AS NEEDED FOR PAIN.  TAKE WITH TYLENOL 500MG   90 tablet  2   No current facility-administered medications on file prior to visit.    BP 186/84  Pulse 76  Temp(Src) 98.2 F (36.8 C)  Resp 16  Ht 5\' 6"  (1.676 m)  Wt 174 lb (78.926 kg)  BMI 28.10 kg/m2  Objective:   Physical Exam  Constitutional: He is oriented to person, place, and time. He appears well-developed and well-nourished.  HENT:  Head: Normocephalic and atraumatic.  Eyes: Conjunctivae are normal. Pupils are equal, round, and reactive to light.  Neck: Normal range of motion. Neck supple.  Cardiovascular: Normal rate.   Murmur heard. Mild swelling in feet  Pulmonary/Chest: Effort normal and breath sounds normal.  Abdominal: Soft. Bowel sounds are normal.  Genitourinary:  Seeing urology  Musculoskeletal: He exhibits edema.  Neurological: He is alert and oriented to person, place, and time.  Psychiatric: He has a normal mood and affect. His behavior is normal.          Assessment & Plan:  Sees urology\ Appropriate screening blood work today will include  cholesterol CBC and a conference of metabolic panel.  There medications are he is on a diuretic a ARB class drugs Hytrin for his prostate he takes Mobic for arthritis.  We will check a CBC differential for side effects of drugs a comprehensive metabolic panel for his hypertension and drug side effect as well we'll monitor the effect of Crestor with a lipid panel Subjective:    John Leiker. is a 80 y.o. male who presents for Medicare Annual/Subsequent preventive examination.   Preventive Screening-Counseling & Management  Tobacco History  Smoking status  . Never Smoker   Smokeless tobacco  . Not on file    Problems Prior to Visit 1.   Current Problems (verified) Patient Active Problem List   Diagnosis Date Noted  . OA (osteoarthritis) 03/01/2013  . Degenerative arthritis 09/28/2012  . HYPERLIPIDEMIA 08/26/2006  . HYPERTENSION 08/26/2006  . PROSTATE SPECIFIC ANTIGEN, ELEVATED 06/24/2006    Medications Prior to Visit Current Outpatient Prescriptions on File Prior to Visit  Medication Sig Dispense Refill  . amLODipine (NORVASC) 10 MG tablet Take 1 tablet (10 mg total) by mouth daily.  90 tablet  3  . carvedilol (COREG) 25 MG tablet Take 1 tablet (25 mg total) by mouth 2 (two) times daily with a meal.  60 tablet  6  . furosemide (LASIX) 40 MG tablet Take 1.5 tablets (60 mg total) by mouth daily.  60 tablet  11  . meloxicam (MOBIC) 15 MG tablet TAKE ONE TABLET BY MOUTH ONCE DAILY  30 tablet  6  . Multiple Vitamin (MULTIVITAMIN) capsule Take 1 capsule by mouth daily.        Marland Kitchen olmesartan (BENICAR) 40 MG tablet Take 1 tablet (40 mg total) by mouth daily.  90 tablet  3  . rosuvastatin (CRESTOR) 10 MG tablet Take 1 tablet (10 mg total) by mouth daily.  30 tablet  11  . terazosin (HYTRIN) 2 MG capsule Take 1 capsule (2 mg total) by mouth at bedtime. For BPH  30 capsule  11  . traMADol (ULTRAM) 50 MG tablet TAKE ONE TABLET BY MOUTH EVERY 6 HOURS AS NEEDED FOR PAIN.  TAKE WITH  TYLENOL 500MG   90 tablet  2   No current facility-administered medications on file prior to visit.    Current Medications (verified) Current Outpatient Prescriptions  Medication Sig Dispense Refill  . amLODipine (NORVASC) 10 MG tablet Take 1 tablet (10 mg total) by mouth daily.  90 tablet  3  . carvedilol (COREG) 25 MG tablet Take 1 tablet (25 mg total) by mouth 2 (two) times daily with a meal.  60 tablet  6  . furosemide (LASIX) 40 MG tablet Take 1.5 tablets (60 mg total) by mouth  daily.  60 tablet  11  . meloxicam (MOBIC) 15 MG tablet TAKE ONE TABLET BY MOUTH ONCE DAILY  30 tablet  6  . Multiple Vitamin (MULTIVITAMIN) capsule Take 1 capsule by mouth daily.        Marland Kitchen olmesartan (BENICAR) 40 MG tablet Take 1 tablet (40 mg total) by mouth daily.  90 tablet  3  . rosuvastatin (CRESTOR) 10 MG tablet Take 1 tablet (10 mg total) by mouth daily.  30 tablet  11  . terazosin (HYTRIN) 2 MG capsule Take 1 capsule (2 mg total) by mouth at bedtime. For BPH  30 capsule  11  . traMADol (ULTRAM) 50 MG tablet TAKE ONE TABLET BY MOUTH EVERY 6 HOURS AS NEEDED FOR PAIN.  TAKE WITH TYLENOL 500MG   90 tablet  2   No current facility-administered medications for this visit.     Allergies (verified) Penicillins   PAST HISTORY  Family History Family History  Problem Relation Age of Onset  . Stroke Mother   . COPD Father     Social History History  Substance Use Topics  . Smoking status: Never Smoker   . Smokeless tobacco: Not on file  . Alcohol Use: No    Are there smokers in your home (other than you)?  No  Risk Factors Current exercise habits: The patient does not participate in regular exercise at present.  Dietary issues discussed: low cholesterol   Cardiac risk factors: advanced age (older than 46 for men, 51 for women), dyslipidemia, hypertension and male gender.  Depression Screen (Note: if answer to either of the following is "Yes", a more complete depression screening is indicated)     Q1: Over the past two weeks, have you felt down, depressed or hopeless? No  Q2: Over the past two weeks, have you felt little interest or pleasure in doing things? No  Have you lost interest or pleasure in daily life? No  Do you often feel hopeless? No  Do you cry easily over simple problems? No  Activities of Daily Living In your present state of health, do you have any difficulty performing the following activities?:  Driving? No Managing money?  No Feeding yourself? No Getting from bed to chair? No Climbing a flight of stairs? No Preparing food and eating?: No Bathing or showering? No Getting dressed: No Getting to the toilet? No Using the toilet:No Moving around from place to place: No In the past year have you fallen or had a near fall?:No   Are you sexually active?  No  Do you have more than one partner?  No  Hearing Difficulties: No Do you often ask people to speak up or repeat themselves? No Do you experience ringing or noises in your ears? No Do you have difficulty understanding soft or whispered voices? No   Do you feel that you have a problem with memory? No  Do you often misplace items? No  Do you feel safe at home?  Yes  Cognitive Testing  Alert? Yes  Normal Appearance?Yes  Oriented to person? Yes  Place? Yes   Time? Yes  Recall of three objects?  Yes  Can perform simple calculations? Yes  Displays appropriate judgment?Yes  Can read the correct time from a watch face?Yes   Advanced Directives have been discussed with the patient? Yes   List the Names of Other Physician/Practitioners you currently use: 1.    Indicate any recent Medical Services you may have received from other than [facility] providers in  the past year (date may be approximate).  Immunization History  Administered Date(s) Administered  . Pneumococcal Conjugate-13 03/01/2013  . Pneumococcal Polysaccharide-23 01/14/2005  . Td 01/14/1998, 03/01/2013    Screening Tests Health Maintenance   Topic Date Due  . Colonoscopy  01/26/1985  . Influenza Vaccine  09/29/2022 (Originally 08/14/2012)  . Zostavax  09/29/2022 (Originally 01/27/1995)  . Tetanus/tdap  09/29/2022 (Originally 01/15/2008)  . Pneumococcal Polysaccharide Vaccine Age 96 And Over  Completed    All answers were reviewed with the patient and necessary referrals were made:  Georgetta Haber, MD   03/01/2013   History reviewed: allergies, current medications, past family history, past medical history, past social history, past surgical history and problem list  Review of Systems Pertinent items are noted in HPI.    Objective:     Vision by Snellen chart: right eye:20/30, left eye:20/30 Blood pressure 144/86, pulse 76, temperature 98.2 F (36.8 C), resp. rate 16, height 5\' 6"  (1.676 m), weight 174 lb (78.926 kg). Body mass index is 28.1 kg/(m^2).  BP 144/86  Pulse 76  Temp(Src) 98.2 F (36.8 C)  Resp 16  Ht 5\' 6"  (1.676 m)  Wt 174 lb (78.926 kg)  BMI 28.10 kg/m2  General Appearance:    Alert, cooperative, no distress, appears stated age  Head:    Normocephalic, without obvious abnormality, atraumatic  Eyes:    PERRL, conjunctiva/corneas clear, EOM's intact, fundi    benign, both eyes       Ears:    Normal TM's and external ear canals, both ears  Nose:   Nares normal, septum midline, mucosa normal, no drainage    or sinus tenderness  Throat:   Lips, mucosa, and tongue normal; teeth and gums normal  Neck:   Supple, symmetrical, trachea midline, no adenopathy;       thyroid:  No enlargement/tenderness/nodules; no carotid   bruit or JVD  Back:     Symmetric, no curvature, ROM normal, no CVA tenderness  Lungs:     Clear to auscultation bilaterally, respirations unlabored  Chest wall:    No tenderness or deformity  Heart:    Regular rate and rhythm, S1 and S2 normal, no murmur, rub   or gallop  Abdomen:     Soft, non-tender, bowel sounds active all four quadrants,    no masses, no organomegaly  Genitalia:     Normal male without lesion, discharge or tenderness  Rectal:    Normal tone, normal prostate, no masses or tenderness;   guaiac negative stool  Extremities:   Extremities normal, atraumatic, no cyanosis or edema  Pulses:   2+ and symmetric all extremities  Skin:   Skin color, texture, turgor normal, no rashes or lesions  Lymph nodes:   Cervical, supraclavicular, and axillary nodes normal  Neurologic:   CNII-XII intact. Normal strength, sensation and reflexes      throughout       Assessment:      Patient presents for yearly preventative medicine examination.   all immunizations and health maintenance protocols were reviewed with the patient and they are up to date with these protocols.   screening laboratory values were reviewed with the patient including screening of hyperlipidemia PSA renal function and hepatic function.   There medications past medical history social history problem list and allergies were reviewed in detail.   Goals were established with regard to weight loss exercise diet in compliance with medications       Plan:     During  the course of the visit the patient was educated and counseled about appropriate screening and preventive services including:    Pneumococcal vaccine   He refuses a tetanus shot and flu shot  Diet review for nutrition referral? Yes ____  Not Indicated ____   Patient Instructions (the written plan) was given to the patient.  Medicare Attestation I have personally reviewed: The patient's medical and social history Their use of alcohol, tobacco or illicit drugs Their current medications and supplements The patient's functional ability including ADLs,fall risks, home safety risks, cognitive, and hearing and visual impairment Diet and physical activities Evidence for depression or mood disorders  The patient's weight, height, BMI, and visual acuity have been recorded in the chart.  I have made referrals, counseling, and  provided education to the patient based on review of the above and I have provided the patient with a written personalized care plan for preventive services.     Georgetta Haber, MD   03/01/2013

## 2013-03-03 ENCOUNTER — Telehealth: Payer: Self-pay | Admitting: Internal Medicine

## 2013-03-03 NOTE — Telephone Encounter (Signed)
Relevant patient education mailed to patient.  

## 2013-03-29 ENCOUNTER — Encounter: Payer: Self-pay | Admitting: Internal Medicine

## 2013-05-12 DIAGNOSIS — H2510 Age-related nuclear cataract, unspecified eye: Secondary | ICD-10-CM | POA: Diagnosis not present

## 2013-06-23 ENCOUNTER — Other Ambulatory Visit: Payer: Self-pay | Admitting: Internal Medicine

## 2013-06-28 ENCOUNTER — Other Ambulatory Visit: Payer: Self-pay | Admitting: Internal Medicine

## 2013-07-20 ENCOUNTER — Other Ambulatory Visit: Payer: Self-pay | Admitting: Internal Medicine

## 2013-08-02 ENCOUNTER — Other Ambulatory Visit: Payer: Self-pay | Admitting: Internal Medicine

## 2013-08-30 ENCOUNTER — Ambulatory Visit: Payer: Medicare Other | Admitting: Internal Medicine

## 2013-08-31 ENCOUNTER — Other Ambulatory Visit: Payer: Self-pay | Admitting: Internal Medicine

## 2013-09-20 ENCOUNTER — Other Ambulatory Visit: Payer: Self-pay | Admitting: Internal Medicine

## 2013-11-02 ENCOUNTER — Telehealth: Payer: Self-pay | Admitting: Internal Medicine

## 2013-11-02 MED ORDER — TRAMADOL HCL 50 MG PO TABS: ORAL_TABLET | ORAL | Status: DC

## 2013-11-02 NOTE — Telephone Encounter (Signed)
[id number] - [place] ([address]), [place] - [address] is requesting re-fill on traMADol (ULTRAM) 50 MG tablet

## 2013-11-02 NOTE — Telephone Encounter (Signed)
Pt has an appt with Dr Yong Channel in January to est.  Rx called in to last till appt.

## 2013-11-07 ENCOUNTER — Emergency Department (HOSPITAL_COMMUNITY): Payer: Medicare Other

## 2013-11-07 ENCOUNTER — Emergency Department (HOSPITAL_COMMUNITY)
Admission: EM | Admit: 2013-11-07 | Discharge: 2013-11-08 | Disposition: A | Discharge status: Home / Self Care | Payer: Medicare Other | Attending: Emergency Medicine | Admitting: Emergency Medicine

## 2013-11-07 ENCOUNTER — Encounter (HOSPITAL_COMMUNITY): Payer: Self-pay | Admitting: Emergency Medicine

## 2013-11-07 DIAGNOSIS — E785 Hyperlipidemia, unspecified: Secondary | ICD-10-CM | POA: Insufficient documentation

## 2013-11-07 DIAGNOSIS — Z791 Long term (current) use of non-steroidal anti-inflammatories (NSAID): Secondary | ICD-10-CM | POA: Insufficient documentation

## 2013-11-07 DIAGNOSIS — Z79899 Other long term (current) drug therapy: Secondary | ICD-10-CM | POA: Insufficient documentation

## 2013-11-07 DIAGNOSIS — M79602 Pain in left arm: Secondary | ICD-10-CM | POA: Diagnosis present

## 2013-11-07 DIAGNOSIS — I10 Essential (primary) hypertension: Secondary | ICD-10-CM | POA: Insufficient documentation

## 2013-11-07 DIAGNOSIS — M62838 Other muscle spasm: Secondary | ICD-10-CM

## 2013-11-07 DIAGNOSIS — R251 Tremor, unspecified: Secondary | ICD-10-CM | POA: Diagnosis not present

## 2013-11-07 DIAGNOSIS — Z880 Allergy status to penicillin: Secondary | ICD-10-CM | POA: Insufficient documentation

## 2013-11-07 DIAGNOSIS — M7989 Other specified soft tissue disorders: Secondary | ICD-10-CM | POA: Diagnosis not present

## 2013-11-07 DIAGNOSIS — M542 Cervicalgia: Secondary | ICD-10-CM | POA: Diagnosis not present

## 2013-11-07 LAB — CBC
HEMATOCRIT: 41.3 % (ref 39.0–52.0)
Hemoglobin: 13.6 g/dL (ref 13.0–17.0)
MCH: 30 pg (ref 26.0–34.0)
MCHC: 32.9 g/dL (ref 30.0–36.0)
MCV: 91 fL (ref 78.0–100.0)
PLATELETS: 223 10*3/uL (ref 150–400)
RBC: 4.54 MIL/uL (ref 4.22–5.81)
RDW: 12.9 % (ref 11.5–15.5)
WBC: 5.4 10*3/uL (ref 4.0–10.5)

## 2013-11-07 LAB — BASIC METABOLIC PANEL
ANION GAP: 11 (ref 5–15)
BUN: 15 mg/dL (ref 6–23)
CALCIUM: 9.2 mg/dL (ref 8.4–10.5)
CHLORIDE: 107 meq/L (ref 96–112)
CO2: 27 mEq/L (ref 19–32)
CREATININE: 1.33 mg/dL (ref 0.50–1.35)
GFR calc Af Amer: 57 mL/min — ABNORMAL LOW (ref >90)
GFR calc non Af Amer: 50 mL/min — ABNORMAL LOW (ref >90)
Glucose, Bld: 125 mg/dL — ABNORMAL HIGH (ref 70–99)
Potassium: 3.6 mEq/L — ABNORMAL LOW (ref 3.7–5.3)
Sodium: 145 mEq/L (ref 137–147)

## 2013-11-07 LAB — I-STAT TROPONIN, ED: Troponin i, poc: 0.01 ng/mL (ref 0.00–0.08)

## 2013-11-07 MED ORDER — CYCLOBENZAPRINE HCL 10 MG PO TABS
5.0000 mg | ORAL_TABLET | Freq: Two times a day (BID) | ORAL | Status: DC | PRN
Start: 2013-11-07 — End: 2014-06-14

## 2013-11-07 NOTE — ED Notes (Signed)
Patient returned from CT

## 2013-11-07 NOTE — Discharge Instructions (Signed)

## 2013-11-07 NOTE — ED Provider Notes (Signed)
CSN: [id number]     Arrival date & time 11/07/13  1637 History   First MD Initiated Contact with Patient 11/07/13 2110     Chief Complaint  Patient presents with  . Arm Pain     (Consider location/radiation/quality/duration/timing/severity/associated sxs/prior Treatment) Patient is a 80 y.o. male presenting with general illness. The history is provided by the patient.  Illness Location:  Arms, first left, then moved to right Quality:  Jerking Severity:  Moderate Onset quality:  Sudden Duration: 1 minutes or so. Timing:  Intermittent Progression:  Unchanged Chronicity:  New Context:  Spontaneously had L arm jerking movements lasting about 1 moinute while trying to go to sleep. No pain in the arm Relieved by:  Nothing Worsened by:  Nothing Associated symptoms: no chest pain, no cough, no diarrhea, no fever, no loss of consciousness, no rhinorrhea and no shortness of breath     Past Medical History  Diagnosis Date  . HYPERLIPIDEMIA 08/26/2006  . HYPERTENSION 08/26/2006  . PROSTATE SPECIFIC ANTIGEN, ELEVATED 06/24/2006   Past Surgical History  Procedure Laterality Date  . Tonsillectomy    . Prostate biopsy    . Hernia repair     Family History  Problem Relation Age of Onset  . Stroke Mother   . COPD Father    History  Substance Use Topics  . Smoking status: Never Smoker   . Smokeless tobacco: Not on file  . Alcohol Use: No    Review of Systems  Constitutional: Negative for fever.  HENT: Negative for rhinorrhea.   Respiratory: Negative for cough and shortness of breath.   Cardiovascular: Negative for chest pain.  Gastrointestinal: Negative for diarrhea.  Neurological: Negative for loss of consciousness.  All other systems reviewed and are negative.     Allergies  Penicillins  Home Medications   Prior to Admission medications   Medication Sig Start Date End Date Taking? Authorizing Provider  amLODipine (NORVASC) 10 MG tablet Take 1 tablet (10 mg total) by  mouth daily. 01/18/13   Ricard Dillon, MD  carvedilol (COREG) 25 MG tablet TAKE ONE TABLET BY MOUTH TWICE DAILY WITH MEALS 08/02/13   Ricard Dillon, MD  CRESTOR 10 MG tablet TAKE ONE TABLET BY MOUTH ONCE DAILY    Ricard Dillon, MD  furosemide (LASIX) 40 MG tablet TAKE ONE & ONE-HALF TABLETS BY MOUTH ONCE DAILY 06/28/13   Ricard Dillon, MD  meloxicam (MOBIC) 15 MG tablet TAKE ONE TABLET BY MOUTH ONCE DAILY 08/31/13   Ricard Dillon, MD  Multiple Vitamin (MULTIVITAMIN) capsule Take 1 capsule by mouth daily.      Historical Provider, MD  olmesartan (BENICAR) 40 MG tablet Take 1 tablet (40 mg total) by mouth daily. 01/18/13   Ricard Dillon, MD  terazosin (HYTRIN) 2 MG capsule TAKE ONE CAPSULE BY MOUTH AT BEDTIME FOR BPH 09/21/13   Ricard Dillon, MD  traMADol (ULTRAM) 50 MG tablet TAKE ONE TABLET BY MOUTH EVERY 6 HOURS AS NEEDED FOR PAIN 11/02/13   Ricard Dillon, MD   BP 179/67  Pulse 78  Temp(Src) 97.5 F (36.4 C) (Oral)  Resp 22  Ht 5\' 6"  (1.676 m)  SpO2 100% Physical Exam  Nursing note and vitals reviewed. Constitutional: He is oriented to person, place, and time. He appears well-developed and well-nourished. No distress.  HENT:  Head: Normocephalic and atraumatic.  Mouth/Throat: No oropharyngeal exudate.  Eyes: EOM are normal. Pupils are equal, round, and reactive to light.  Neck: Normal  range of motion. Neck supple.  Cardiovascular: Normal rate and regular rhythm.  Exam reveals no friction rub.   No murmur heard. Pulmonary/Chest: Effort normal and breath sounds normal. No respiratory distress. He has no wheezes. He has no rales.  Abdominal: He exhibits no distension. There is no tenderness. There is no rebound.  Musculoskeletal: Normal range of motion. He exhibits no edema.  Neurological: He is alert and oriented to person, place, and time.  Skin: No rash noted. He is not diaphoretic.    ED Course  Procedures (including critical care time) Labs Review Labs Reviewed  BASIC  METABOLIC PANEL - Abnormal; Notable for the following:    Potassium 3.6 (*)    Glucose, Bld 125 (*)    GFR calc non Af Amer 50 (*)    GFR calc Af Amer 57 (*)    All other components within normal limits  CBC  I-STAT TROPOININ, ED    Imaging Review Dg Chest 2 View  11/07/2013   CLINICAL DATA:  Jerking of both arms for 3 days, has not been taking his Tramadol; BILATERAL foot swelling for 2 months; personal history of hypertension  EXAM: CHEST  2 VIEW  COMPARISON:  None  FINDINGS: Enlargement of cardiac silhouette.  Mediastinal contours and pulmonary vascularity normal.  Lungs clear.  No pleural effusion or pneumothorax.  Bones demineralized.  IMPRESSION: No acute abnormalities.   Electronically Signed   By: Lavonia Dana M.D.   On: 11/07/2013 17:59     EKG Interpretation   Date/Time:  Sunday November 07 2013 17:15:29 EDT Ventricular Rate:  75 PR Interval:  168 QRS Duration: 94 QT Interval:  384 QTC Calculation: 428 R Axis:   75 Text Interpretation:  Sinus rhythm with frequent Premature ventricular  complexes T wave abnormality, consider inferolateral ischemia Abnormal ECG  no prior EKG Confirmed by Mingo Amber  MD, Tayley Mudrick ([id number]) on 11/07/2013 9:29:00  PM      MDM   Final diagnoses:  Tremor  Muscle spasm    74M presents with intermittent episodes of arm jerking. Lasting about a minute each. Initially was left arm, then moved to R arm. No other tremors or shaking activity. No hx of seizures. No CP, SOB, N/V/D. Was on tramadol, but stopped the tramadol about 3 days ago because he wasn't having any pain.  Here Neuro exam ok. Remainder of exam nonfocal. Will check Head CT to look for possible issue causing a seizure. Head CT ok. I spoke with Neurology who stated doesn't sound like a seizure, but he was ok with Neuro f/u. Patient comfortable with f/u plan. Given flexeril for muscle spasms.  Evelina Bucy, MD 11/08/13 [phone]

## 2013-11-07 NOTE — ED Notes (Addendum)
C/o intermittent jerking in arms x 2 days and neck pain.  Pt states he stopped taking Tramadol 3 days ago and thinks symptoms may be related.  Also c/o swelling to bilateral feet x 2-3 months.  Denies sob.  Pt states he had stopped taking Tramadol because he wasn't having pain.  States when arms were jerking and wouldn't stop he took a Tramadol and symptoms stopped.  Taking Tramadol as needed for pain since 2012.

## 2013-11-07 NOTE — ED Notes (Signed)
NO jerking or pain present upon assessment.

## 2013-11-08 ENCOUNTER — Ambulatory Visit (INDEPENDENT_AMBULATORY_CARE_PROVIDER_SITE_OTHER): Payer: Medicare Other | Admitting: Neurology

## 2013-11-08 ENCOUNTER — Telehealth: Payer: Self-pay | Admitting: Internal Medicine

## 2013-11-08 ENCOUNTER — Encounter: Payer: Self-pay | Admitting: Neurology

## 2013-11-08 VITALS — BP 142/70 | HR 75 | Temp 98.4°F | Ht 64.0 in | Wt 177.0 lb

## 2013-11-08 DIAGNOSIS — G253 Myoclonus: Secondary | ICD-10-CM | POA: Diagnosis not present

## 2013-11-08 DIAGNOSIS — E876 Hypokalemia: Secondary | ICD-10-CM | POA: Diagnosis not present

## 2013-11-08 DIAGNOSIS — M7989 Other specified soft tissue disorders: Secondary | ICD-10-CM

## 2013-11-08 NOTE — ED Notes (Signed)
Pt discharged home with all belongings, pt alert, oriented and ambulatory upon discharge. Pt escorted to exit via wheelchair by [employer]. 1 new RX prescribed, pt verbalizes understanding of discharge instructions, pt driven home by son

## 2013-11-08 NOTE — Progress Notes (Signed)
Subjective:    Patient ID: John Hutchinson. is a 80 y.o. male.  HPI    Star Age, MD, PhD [facility] [address], [address] [address] [place], [place] [address]  Dear Dr. Mingo Amber,   I saw patient John Hutchinson, upon your kind request and my neurologic clinic today for initial consultation of new onset of involuntary arm movements. The patient is accompanied by his wife today. The patient is referred from the emergency room where he was seen on 11/07/2013 with new onset involuntary jerk-like movements primarily in the left upper extremity lasting for about a minute at a time and which started when he was trying to fall asleep on 11/06/13. He has an underlying medical history of hypertension, degenerative arthritis, hyperlipidemia and overweight state. He noticed it first on LUE, while trying to go to sleep. He had a jerking, which was intermittent, and lasted off and on for hours. The next day, it was the RUE. He retains full consciousness, but has no He had stopped taking tramadol which he felt may have been the cause of these involuntary movements. He was given Flexeril in the emergency room. He had a head CT without contrast on 11/07/2013 which I reviewed: 1. No acute intracranial process. 2. Mild to moderate chronic small vessel ischemic changes. In addition, I personally reviewed the images through the PACS system.  He was given flexeril in the ER  Never had TIA or stroke symptoms, denying sudden onset of one sided weakness, numbness, tingling, slurring of speech or droopy face, hearing loss, tinnitus, diplopia or visual field cut or monocular loss of vision, and denies recurrent headaches.   He snores intermittently and mildly.  Flexeril at 10 mg bid has helped, but makes him sleepy.   His Past Medical History Is Significant For: Past Medical History  Diagnosis Date  . HYPERLIPIDEMIA 08/26/2006  . HYPERTENSION 08/26/2006  . PROSTATE SPECIFIC ANTIGEN, ELEVATED  06/24/2006    His Past Surgical History Is Significant For: Past Surgical History  Procedure Laterality Date  . Tonsillectomy    . Prostate biopsy    . Hernia repair      His Family History Is Significant For: Family History  Problem Relation Age of Onset  . Stroke Mother   . COPD Father     His Social History Is Significant For: History   Social History  . Marital Status: Married    Spouse Name: [name]    Number of Children: 6  . Years of Education: 16   Occupational History  . retired    Social History Main Topics  . Smoking status: Never Smoker   . Smokeless tobacco: Never Used  . Alcohol Use: No  . Drug Use: No  . Sexual Activity: Yes   Other Topics Concern  . None   Social History Narrative   Consumes no caffeine    His Allergies Are:  Allergies  Allergen Reactions  . Penicillins Other (See Comments)    unknown  :   His Current Medications Are:  Outpatient Encounter Prescriptions as of 11/08/2013  Medication Sig  . amLODipine (NORVASC) 10 MG tablet Take 1 tablet (10 mg total) by mouth daily.  . carvedilol (COREG) 25 MG tablet Take 25 mg by mouth 2 (two) times daily with a meal.  . cyclobenzaprine (FLEXERIL) 10 MG tablet Take 0.5-1 tablets (5-10 mg total) by mouth 2 (two) times daily as needed for muscle spasms.  . furosemide (LASIX) 40 MG tablet Take  60 mg by mouth daily. Take 1.5 tablets  . meloxicam (MOBIC) 15 MG tablet Take 15 mg by mouth daily.  . Multiple Vitamin (MULTIVITAMIN) capsule Take 1 capsule by mouth daily.    John Kitchen olmesartan (BENICAR) 40 MG tablet Take 1 tablet (40 mg total) by mouth daily.  . rosuvastatin (CRESTOR) 10 MG tablet Take 10 mg by mouth every evening.  . terazosin (HYTRIN) 2 MG capsule Take 2 mg by mouth at bedtime.  . traMADol (ULTRAM) 50 MG tablet Take 50 mg by mouth every 6 (six) hours as needed for moderate pain.   Review of Systems:  Out of a complete 14 point review of systems, all are reviewed and negative with the  exception of these symptoms as listed below:   Review of Systems  Musculoskeletal:       Joint swelling  Neurological: Positive for tremors.  Psychiatric/Behavioral:       Not enough sleep    Objective:  Neurologic Exam  Physical Exam Physical Examination:   Filed Vitals:   11/08/13 1355  BP: 142/70  Pulse: 75  Temp:     General Examination: The patient is a very pleasant 80 y.o. male in no acute distress. He appears well-developed and well-nourished and well groomed.   HEENT: Normocephalic, atraumatic, pupils are equal, round and reactive to light and accommodation. Funduscopic exam is normal with sharp disc margins noted. Extraocular tracking is good without limitation to gaze excursion or nystagmus noted. Normal smooth pursuit is noted. Hearing is grossly intact. Tympanic membranes are clear bilaterally. Face is symmetric with normal facial animation and normal facial sensation. Speech is clear with no dysarthria noted. There is no hypophonia. There is no lip, neck/head, jaw or voice tremor. Neck is supple with full range of passive and active motion. There are no carotid bruits on auscultation. Oropharynx exam reveals: moderate mouth dryness, adequate dental hygiene and mild airway crowding. Mallampati is class II. Tongue protrudes centrally and palate elevates symmetrically.    Chest: Clear to auscultation without wheezing, rhonchi or crackles noted.  Heart: S1+S2+0, regular and normal without murmurs, rubs or gallops noted.   Abdomen: Soft, non-tender and non-distended with normal bowel sounds appreciated on auscultation.  Extremities: There is 3+ pitting edema in the distal lower extremities bilaterally. Pedal pulses are intact.  Skin: Warm and dry without trophic changes noted. There are no varicose veins.  Musculoskeletal: exam reveals no obvious joint deformities, tenderness or joint swelling or erythema.   Neurologically:  Mental status: The patient is awake, alert  and oriented in all 4 spheres. Her immediate and remote memory, attention, language skills and fund of knowledge are appropriate. There is no evidence of aphasia, agnosia, apraxia or anomia. Speech is clear with normal prosody and enunciation. Thought process is linear. Mood is normal and affect is normal.  Cranial nerves II - XII are as described above under HEENT exam. In addition: shoulder shrug is normal with equal shoulder height noted. Motor exam: Normal bulk, strength and tone is noted. There is no drift, tremor or rebound. Romberg is negative. Reflexes are 2+ in the upper extremities and trace in the lower extremities. I did not detect any myoclonus, tremors, or any other involuntary movements. On Archimedes spiral drawing he has no significant tremulousness and handwriting is legible and not micrographic. Babinski: Toes are flexor bilaterally. Fine motor skills and coordination: intact with normal finger taps, normal hand movements, normal rapid alternating patting, normal foot taps and normal foot agility.  Cerebellar testing:  No dysmetria or intention tremor on finger to nose testing. Heel to shin is difficult with the left secondary to hip discomfort. There is no truncal or gait ataxia.  Sensory exam: intact to light touch, pinprick, vibration, temperature sense and in the upper and lower extremities.  Gait, station and balance: He stands with difficulty. No veering to one side is noted. No leaning to one side is noted. Posture is age-appropriate and stance is narrow based. Gait shows normal stride length and normal pace. No problems turning are noted. He has a slight limp and does endorse that his left hip has been bothering him.   Assessment and Plan:   In summary, John Hutchinson. is a very pleasant 80 y.o.-year old male with  an underlying medical history of hypertension, lower extremity edema, degenerative arthritis, and hyperlipidemia, who presents with new onset of involuntary jerk-like  movements of the upper extremities. On examination I did not detect any focal findings and no involuntary movements are seen. He may have myoclonus secondary to medications, dehydration, low potassium or a combination of issues. I'm worried about his significant lower extremity swelling. He is on 60 mg of Lasix. I advised him to eat a banana every day as his potassium level last night was 3.4. He is furthermore advised to elevate his legs and use compression stockings which he has. I suggested he can continue with Flexeril as needed but advised him that it can be quite sedating. I would not want him to take more than 10 mg twice daily as needed. He is advised to make a sooner than scheduled appointment with his primary care physician and his wife indicated that she called his office today. It looks like they are working on getting the patient into the office sometime this week. I would like to pursue a brain MRI with and without contrast to look for any underlying organic cause of myoclonus but the patient and his wife are adamant that he would not be able to go through an MRI at this time. He indicates that he is very claustrophobic. I would also like to do an EEG and he is okay doing this at this time. I did not suggest any other medications at this time and we will call him with his EEG results. He is advised to consider doing the MRI and they indicated that they would think about it.   I answered all their questions today and the patient and his wife were in agreement with the above outlined plan. I would like to see the patient back in 3 months, sooner if the need arises and encouraged them to call with any interim questions, concerns, problems or updates and test results.   Thank you very much for allowing me to participate in the care of this nice patient. If I can be of any further assistance to you please do not hesitate to call me at [phone].  Sincerely,   Star Age, MD, PhD

## 2013-11-08 NOTE — Telephone Encounter (Signed)
Patient Information:  Caller Name: [name]  Phone: [phone]  Patient: John Hutchinson, John Hutchinson  Gender: Male  DOB: 1933/04/21  Age: 80 Years  PCP: Benay Pillow (Adults only, leaving end of July 2015)  Office Follow Up:  Does the office need to follow up with this patient?: Yes  Instructions For The Office: Please follow up with patient in regards to question about whether or not symptoms could be related to medication regimen.  Also is requesting feedback on whether or not he can increase dosage of Flexeril prescribed by ED physician last night.  RN Note:  Patient is having complaints of muscle jerks and twitching mainly in the arms and appears to be worse when taking Terazosin (not a new medication).  Patient was seen in the emergency room last night and placed on Flexeril 5-10mg  BID.  Patient is wanting to know if this can be increased to three times a day. No improvement noted at this time with the Flexeril per spouse. No guideline available, patient is wanting to know if symptoms could be a side effect of any of his medications specifically the Terazosin.  Symptoms  Reason For Call & Symptoms: Difficulty sleeping and Tremors  Reviewed Health History In EMR: Yes  Reviewed Medications In EMR: Yes  Reviewed Allergies In EMR: Yes  Reviewed Surgeries / Procedures: Yes  Date of Onset of Symptoms: 11/06/2013  Guideline(s) Used:  No Protocol Available - Sick Adult  Disposition Per Guideline:   Discuss with PCP and Callback by Nurse within 1 Hour  Reason For Disposition Reached:   Nursing judgment  Advice Given:  Call Back If:  New symptoms develop  You become worse.  Patient Will Follow Care Advice:  YES

## 2013-11-08 NOTE — Telephone Encounter (Signed)
John Hutchinson can you please schedule pt per Dr. Yong Channel for sometime this week? Thank you

## 2013-11-08 NOTE — Telephone Encounter (Signed)
I have never met patient and it would be difficult to evaluate. I would encourage him to come in for a visit sometime this week. He can take 34m of flexeril TID but wuld limit max dosage to 275ma day before he is evaluated (so 5,5, 10 would be ok). He should not drive after taking the flexeril. We need to make sure to get him set up with neurology follow up when he comes in. If symptoms worsened, should seek care sooner.

## 2013-11-08 NOTE — Patient Instructions (Signed)
Elevate your legs and use compression stockings on both legs.  Drink more water and eat a banana daily.  See your new primary care doctor soon.  Take Flexeril as needed, 10 mg up to twice daily, but beware, it can make you sleepy.  Please think about the brain MRI! We will do an EEG - brain wave test and call you.  Follow up in 3 months.

## 2013-11-08 NOTE — Telephone Encounter (Signed)
Given to Dr. Yong Channel, please advise.

## 2013-11-09 ENCOUNTER — Ambulatory Visit: Payer: Medicare Other | Admitting: Family Medicine

## 2013-11-16 ENCOUNTER — Ambulatory Visit (INDEPENDENT_AMBULATORY_CARE_PROVIDER_SITE_OTHER): Payer: Medicare Other | Admitting: Radiology

## 2013-11-16 DIAGNOSIS — E876 Hypokalemia: Secondary | ICD-10-CM

## 2013-11-16 DIAGNOSIS — G253 Myoclonus: Secondary | ICD-10-CM

## 2013-11-16 DIAGNOSIS — M7989 Other specified soft tissue disorders: Secondary | ICD-10-CM

## 2013-11-16 NOTE — Procedures (Signed)
    History:  John Hutchinson is a 80 year old gentleman with a history of events of upper extremity jerking that may occur while trying to get to sleep, involving the left arm predominantly. The patient is being evaluated for possible seizure events.  This is a routine EEG. No skull defects are noted. Medications include Norvasc, Coreg, Flexeril, Lasix, Mobic, multivitamins, Benicar, Crestor, Hytrin, and Ultram.   EEG classification: Normal awake and drowsy  Description of the recording: The background rhythms of this recording consists of a fairly well modulated medium amplitude alpha rhythm of 8 Hz that is reactive to eye opening and closure. As the record progresses, the patient appears to remain in the waking state throughout the recording. Photic stimulation was performed, resulting in a bilateral and symmetric photic driving response. Hyperventilation was not performed. Toward the end of the recording, the patient enters the drowsy state with slight symmetric slowing seen. The patient never enters stage II sleep. At no time during the recording does there appear to be evidence of spike or spike wave discharges or evidence of focal slowing. EKG monitor shows no evidence of cardiac rhythm abnormalities with a heart rate of 78.  Impression: This is a normal EEG recording in the waking and drowsy state. No evidence of ictal or interictal discharges are seen.

## 2013-11-17 NOTE — Progress Notes (Signed)
Quick Note:  Please call and advise the patient that the EEG or brain wave test we performed was reported as normal in the awake and drowsy states. We checked for abnormal electrical discharges in the brain waves and the report suggested normal findings. No further action is required on this test at this time. Please remind patient to keep any upcoming appointments or tests and to call us with any interim questions, concerns, problems or updates. Thanks,  Devone Tousley, MD, PhD    ______ 

## 2013-11-18 NOTE — Progress Notes (Signed)
Quick Note:  I spoke to pt and let him know that the EEG was normal (no seizures). He verbalized understanding. ______

## 2014-01-18 ENCOUNTER — Other Ambulatory Visit: Payer: Self-pay

## 2014-01-18 MED ORDER — AMLODIPINE BESYLATE 10 MG PO TABS: 10.0000 mg | ORAL_TABLET | Freq: Every day | ORAL | Status: DC

## 2014-01-18 NOTE — Telephone Encounter (Signed)
Rx reqeust for amlodipine 10 mg.  Rx sent to pharmacy.

## 2014-02-04 ENCOUNTER — Ambulatory Visit: Payer: Medicare Other | Admitting: Family Medicine

## 2014-02-08 ENCOUNTER — Ambulatory Visit: Payer: Self-pay | Admitting: Neurology

## 2014-02-14 ENCOUNTER — Other Ambulatory Visit: Payer: Self-pay

## 2014-02-14 MED ORDER — OLMESARTAN MEDOXOMIL 40 MG PO TABS: 40.0000 mg | ORAL_TABLET | Freq: Every day | ORAL | Status: DC

## 2014-02-14 NOTE — Telephone Encounter (Signed)
Rx request for Benicar 40 mg tablet-Take 1 tablet by mouth once daily #90  Pharm:  [facility]   Rx sent to pharmacy.

## 2014-02-17 ENCOUNTER — Encounter: Payer: Self-pay | Admitting: Family Medicine

## 2014-02-17 ENCOUNTER — Ambulatory Visit (INDEPENDENT_AMBULATORY_CARE_PROVIDER_SITE_OTHER): Payer: Medicare Other | Admitting: Family Medicine

## 2014-02-17 VITALS — BP 142/78 | Temp 97.8°F | Wt 187.0 lb

## 2014-02-17 DIAGNOSIS — I10 Essential (primary) hypertension: Secondary | ICD-10-CM | POA: Diagnosis not present

## 2014-02-17 DIAGNOSIS — R609 Edema, unspecified: Secondary | ICD-10-CM | POA: Diagnosis not present

## 2014-02-17 DIAGNOSIS — M150 Primary generalized (osteo)arthritis: Secondary | ICD-10-CM

## 2014-02-17 DIAGNOSIS — N183 Chronic kidney disease, stage 3 unspecified: Secondary | ICD-10-CM

## 2014-02-17 DIAGNOSIS — M159 Polyosteoarthritis, unspecified: Secondary | ICD-10-CM

## 2014-02-17 DIAGNOSIS — N182 Chronic kidney disease, stage 2 (mild): Secondary | ICD-10-CM | POA: Insufficient documentation

## 2014-02-17 LAB — POCT URINALYSIS DIPSTICK
Bilirubin, UA: NEGATIVE
Blood, UA: NEGATIVE
Glucose, UA: NEGATIVE
KETONES UA: NEGATIVE
LEUKOCYTES UA: NEGATIVE
Nitrite, UA: NEGATIVE
Protein, UA: NEGATIVE
Spec Grav, UA: 1.01
UROBILINOGEN UA: 0.2
pH, UA: 7

## 2014-02-17 LAB — COMPREHENSIVE METABOLIC PANEL
ALBUMIN: 4.1 g/dL (ref 3.5–5.2)
ALK PHOS: 74 U/L (ref 39–117)
ALT: 14 U/L (ref 0–53)
AST: 16 U/L (ref 0–37)
BUN: 20 mg/dL (ref 6–23)
CHLORIDE: 110 meq/L (ref 96–112)
CO2: 27 meq/L (ref 19–32)
CREATININE: 1.33 mg/dL (ref 0.40–1.50)
Calcium: 9.1 mg/dL (ref 8.4–10.5)
GFR: 66.69 mL/min (ref >60.00)
GLUCOSE: 92 mg/dL (ref 70–99)
Potassium: 3.7 mEq/L (ref 3.5–5.1)
SODIUM: 145 meq/L (ref 135–145)
Total Bilirubin: 0.7 mg/dL (ref 0.2–1.2)
Total Protein: 6.6 g/dL (ref 6.0–8.3)

## 2014-02-17 LAB — CBC
HEMATOCRIT: 40.7 % (ref 39.0–52.0)
Hemoglobin: 13.5 g/dL (ref 13.0–17.0)
MCHC: 33.1 g/dL (ref 30.0–36.0)
MCV: 90.6 fl (ref 78.0–100.0)
Platelets: 227 10*3/uL (ref 150.0–400.0)
RBC: 4.49 Mil/uL (ref 4.22–5.81)
RDW: 13.4 % (ref 11.5–15.5)
WBC: 5.6 10*3/uL (ref 4.0–10.5)

## 2014-02-17 LAB — TSH: TSH: 2.76 u[IU]/mL (ref 0.35–4.50)

## 2014-02-17 MED ORDER — TRAMADOL HCL 50 MG PO TABS: 25.0000 mg | ORAL_TABLET | Freq: Four times a day (QID) | ORAL | Status: DC | PRN

## 2014-02-17 NOTE — Patient Instructions (Signed)
Evaluate swelling with echocardiogram and labs today.   You do have some moderate kidney damage. Let's stop the mobic to protect your kidneys. This could possibly help with swelling. Take tramadol instead for pain.   About a week after your echocardiogram is completed, come see me so we can discuss a plan to help your swelling  We may also add a medication to help you with frequent urination called flomax.

## 2014-02-17 NOTE — Assessment & Plan Note (Signed)
reasonable control on  Olmesartan 40mg , coreg 25mg  BID, amlodipine 10mg , lasix 60mg , terazosin 2mg . Though ideally want SBP below 140 with CKD. Discuss possible HCTZ vs titrating other agents as likely going to take patient off terazosin with edema and still poor control BPH

## 2014-02-17 NOTE — Progress Notes (Signed)
John Reddish, MD Phone: [phone]  Subjective:  Patient presents today to establish care with me as their new primary care provider. Patient was formerly a patient of Dr. Wynonia Sours. Chief complaint-noted.   Edema Swelling in legs for about a 1.5 years. 1.5 years ago increased lasix.  Nocturia 4-5x at night. Reports PSA evaluation with biopsy in 2008. He is on terazosin and amlodipine which can cause edema.  ROS- denies chest pain, shortness of breath, edema, orthopnea, PND.   CKD Stage III stable/new diagnosis with GFR just below 60 and cr 1.2-1.3 typically.  Patient is taking mobic daily for arthritis. On lasix for edema primarily ROS- no oliguria, no changes in urination pattern recently.   Arthritis-reasonable control As above on mobic. Has also taken tramadol with good relief in past. Primarily L knee and L hip but has some low back issues as well.  Ros- no hot swollen joints  Hypertension-reasonable control on  Olmesartan 40mg , coreg 25mg  BID, amlodipine 10mg , lasix 60mg , terazosin 2mg  BP Readings from Last 3 Encounters:  02/17/14 142/78  11/08/13 142/70  03/01/13 144/86  TOok BP meds 5 minutes before coming in.  Home BP monitoring-no Compliant with medications-yes with edema SE of amlodipine ROS-Denies any CP, HA, SOB, blurry vision,  transient weakness. Edema as above  The following were reviewed and entered/updated in epic: Past Medical History  Diagnosis Date  . HYPERLIPIDEMIA 08/26/2006  . HYPERTENSION 08/26/2006  . PROSTATE SPECIFIC ANTIGEN, ELEVATED 06/24/2006   Patient Active Problem List   Diagnosis Date Noted  . CKD (chronic kidney disease), stage III 02/17/2014    Priority: Medium  . HYPERLIPIDEMIA 08/26/2006    Priority: Medium  . Essential hypertension 08/26/2006    Priority: Medium  . Degenerative arthritis 09/28/2012    Priority: Low  . PROSTATE SPECIFIC ANTIGEN, ELEVATED 06/24/2006    Priority: Low   Past Surgical History  Procedure Laterality  Date  . Tonsillectomy    . Prostate biopsy    . Hernia repair      Family History  Problem Relation Age of Onset  . Stroke Mother     70s, smoker  . COPD Father     Medications- reviewed and updated Current Outpatient Prescriptions  Medication Sig Dispense Refill  . amLODipine (NORVASC) 10 MG tablet Take 1 tablet (10 mg total) by mouth daily. 90 tablet 1  . carvedilol (COREG) 25 MG tablet Take 25 mg by mouth 2 (two) times daily with a meal.    . furosemide (LASIX) 40 MG tablet Take 60 mg by mouth daily. Take 1.5 tablets    . meloxicam (MOBIC) 15 MG tablet Take 15 mg by mouth daily.    . Multiple Vitamin (MULTIVITAMIN) capsule Take 1 capsule by mouth daily.      Marland Kitchen olmesartan (BENICAR) 40 MG tablet Take 1 tablet (40 mg total) by mouth daily. 90 tablet 1  . rosuvastatin (CRESTOR) 10 MG tablet Take 10 mg by mouth every evening.    . terazosin (HYTRIN) 2 MG capsule Take 2 mg by mouth at bedtime.    . cyclobenzaprine (FLEXERIL) 10 MG tablet Take 0.5-1 tablets (5-10 mg total) by mouth 2 (two) times daily as needed for muscle spasms. (Patient not taking: Reported on 02/17/2014) 20 tablet 0  . traMADol (ULTRAM) 50 MG tablet Take 50 mg by mouth every 6 (six) hours as needed for moderate pain.     Allergies-reviewed and updated Allergies  Allergen Reactions  . Penicillins Other (See Comments)    unknown  History   Social History  . Marital Status: Married    Spouse Name: [name]    Number of Children: 6  . Years of Education: 16   Occupational History  . retired    Social History Main Topics  . Smoking status: Former Smoker -- 1.00 packs/day for 3 years    Types: Cigarettes    Quit date: 01/15/1955  . Smokeless tobacco: Never Used  . Alcohol Use: No  . Drug Use: No  . Sexual Activity: Yes   Other Topics Concern  . None   Social History Narrative   Married (wife patient of Dr. Yong Channel). 6 children. 3 grandkids.       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   Ext: 2+ equal pitting edema Skin: warm, dry, no rash Neuro: grossly normal, moves all extremities, PERRLA   Assessment/Plan:  Edema CMET, TSH, cbc, echo although labs within a year reasonable. No echo noted. Possible diastolic CHF would be my guess though other than edema no clear symptoms. If workup unrevealing, consider stopping terazosin and switching to flomax. Coming off mobic may help. Could also consider decreasing amlodipine and adding hctz.   Essential hypertension reasonable control on  Olmesartan 40mg , coreg 25mg  BID, amlodipine 10mg , lasix 60mg , terazosin 2mg . Though ideally want SBP below 140 with CKD. Discuss possible HCTZ vs titrating other agents as likely going to take patient off terazosin with edema and still poor control BPH   Degenerative arthritis Stop mobic due to CKD. Rx for tramadol given to help control pain. Would prefer 25mg  if effective.    CKD (chronic kidney disease), stage III New diagnosis. Could contribute to edema. Fortunately on ace-i and will continue. Avoid nsaids. Discussed tylenol and tramadol only.     Return precautions advised. Follow up within a week of echo to discuss plan moving forward for edema. Could also consider compression stockings.   Orders Placed This Encounter  Procedures  . CBC    [place]  . Comprehensive metabolic panel    [place]  . TSH    [facility]  . POCT urinalysis dipstick  . 2D Echocardiogram without contrast    Standing Status: Future     Number of Occurrences:      Standing Expiration Date: 02/18/2015    Scheduling Instructions:     2+ edema    Order Specific Question:  Type of Echo    Answer:   Complete    Order Specific Question:  Where should this test be performed    Answer:  [facility])    Order Specific Question:  Reason for exam-Echo    Answer:  Other - See Comments Section    Meds ordered this encounter  Medications  . traMADol (ULTRAM) 50 MG tablet    Sig: Take 0.5-1 tablets (25-50 mg total) by mouth every 6 (six) hours as needed for moderate pain (3x a day maximum).    Dispense:  90 tablet    Refill:  5

## 2014-02-17 NOTE — Assessment & Plan Note (Signed)
New diagnosis. Could contribute to edema. Fortunately on ace-i and will continue. Avoid nsaids. Discussed tylenol and tramadol only.

## 2014-02-17 NOTE — Assessment & Plan Note (Signed)
Stop mobic due to CKD. Rx for tramadol given to help control pain. Would prefer 25mg  if effective.

## 2014-02-21 ENCOUNTER — Ambulatory Visit (HOSPITAL_COMMUNITY): Payer: Medicare Other | Attending: Family Medicine

## 2014-02-21 DIAGNOSIS — R600 Localized edema: Secondary | ICD-10-CM

## 2014-02-21 DIAGNOSIS — R609 Edema, unspecified: Secondary | ICD-10-CM | POA: Diagnosis not present

## 2014-02-21 NOTE — Progress Notes (Signed)
2D Echo completed. 02/21/2014

## 2014-02-28 ENCOUNTER — Telehealth: Payer: Self-pay

## 2014-02-28 MED ORDER — FUROSEMIDE 40 MG PO TABS: 60.0000 mg | ORAL_TABLET | Freq: Every day | ORAL | Status: DC

## 2014-02-28 NOTE — Telephone Encounter (Signed)
[facility] refill request for FUROSEMIDE 40MG  TAB. TAKE ONE AND ONE-HALF TABLETS PO QD #60

## 2014-02-28 NOTE — Telephone Encounter (Signed)
Medication refilled

## 2014-03-09 ENCOUNTER — Encounter: Payer: Self-pay | Admitting: Family Medicine

## 2014-03-09 ENCOUNTER — Ambulatory Visit (INDEPENDENT_AMBULATORY_CARE_PROVIDER_SITE_OTHER): Payer: Medicare Other | Admitting: Family Medicine

## 2014-03-09 VITALS — BP 148/68 | Temp 99.0°F | Wt 187.0 lb

## 2014-03-09 DIAGNOSIS — N400 Enlarged prostate without lower urinary tract symptoms: Secondary | ICD-10-CM

## 2014-03-09 DIAGNOSIS — I5189 Other ill-defined heart diseases: Secondary | ICD-10-CM

## 2014-03-09 DIAGNOSIS — I519 Heart disease, unspecified: Secondary | ICD-10-CM

## 2014-03-09 DIAGNOSIS — I10 Essential (primary) hypertension: Secondary | ICD-10-CM

## 2014-03-09 DIAGNOSIS — I5030 Unspecified diastolic (congestive) heart failure: Secondary | ICD-10-CM | POA: Insufficient documentation

## 2014-03-09 MED ORDER — FUROSEMIDE 40 MG PO TABS: 40.0000 mg | ORAL_TABLET | Freq: Two times a day (BID) | ORAL | Status: DC

## 2014-03-09 MED ORDER — TAMSULOSIN HCL 0.4 MG PO CAPS: 0.4000 mg | ORAL_CAPSULE | Freq: Every day | ORAL | Status: DC

## 2014-03-09 NOTE — Patient Instructions (Signed)
Your heart doesn't relax well which increases your swelling.  Advise you to take lasix 40mg  twice a day separated by 6 hours Wear compression stockings  Stop terazosin which can contribute to swelling (would like to stop amlodipine too but you need this for your blood pressure).   Start flomax. Remember to stand up very slowly.   See me within a month to see how changes are doing

## 2014-03-09 NOTE — Assessment & Plan Note (Addendum)
Mild poor control especially considering CKD. Already on Olmesartan 40mg , coreg 25mg  BID, amlodipine 10mg  (which would love to stop given edema). Increase lasix to BID due to continued edema, continue compression stockings. Stop terazosin given minimal BPH help and may add to edema though could add back if needed potentiallyfor BP help. Also consider spironolactone. HCTZ likely not as helpful with CKD Stage III though could consider.

## 2014-03-09 NOTE — Assessment & Plan Note (Signed)
Stop terazosin given minimal improvement in symptoms. Start flomax.

## 2014-03-09 NOTE — Progress Notes (Signed)
[name], MD Phone: [phone]  Subjective:   John Hutchinson. is a 81 y.o. year old very pleasant male patient who presents with the following:  BPH- poor control Nocturia 4x a night even on terazosin. Daytime symptoms as well.  ROS- no dysuria or penile discharge  Hypertension-mild poor control  BP Readings from Last 3 Encounters:  03/09/14 148/68  02/17/14 142/78  11/08/13 142/70  Home BP monitoring-no Compliant with medications-yes without side effects except edema as below ROS-Denies any CP, HA, SOB, blurry vision.   Diastolic dysfunction. Edema Stable. We discussed ROS- no DOE, orthopnea, weight stable since last visit.   Past Medical History- Patient Active Problem List   Diagnosis Date Noted  . BPH (benign prostatic hyperplasia) 03/09/2014    Priority: Medium  . Diastolic dysfunction 23/76/2831    Priority: Medium  . CKD (chronic kidney disease), stage III 02/17/2014    Priority: Medium  . HYPERLIPIDEMIA 08/26/2006    Priority: Medium  . Essential hypertension 08/26/2006    Priority: Medium  . Degenerative arthritis 09/28/2012    Priority: Low  . PROSTATE SPECIFIC ANTIGEN, ELEVATED 06/24/2006    Priority: Low   Medications- reviewed and updated Current Outpatient Prescriptions  Medication Sig Dispense Refill  . amLODipine (NORVASC) 10 MG tablet Take 1 tablet (10 mg total) by mouth daily. 90 tablet 1  . carvedilol (COREG) 25 MG tablet Take 25 mg by mouth 2 (two) times daily with a meal.    . furosemide (LASIX) 40 MG tablet Take 1.5 tablets (60 mg total) by mouth daily. Take 1.5 tablets 30 tablet 3  . Multiple Vitamin (MULTIVITAMIN) capsule Take 1 capsule by mouth daily.      Marland Kitchen olmesartan (BENICAR) 40 MG tablet Take 1 tablet (40 mg total) by mouth daily. 90 tablet 1  . rosuvastatin (CRESTOR) 10 MG tablet Take 10 mg by mouth every evening.    . terazosin (HYTRIN) 2 MG capsule Take 2 mg by mouth at bedtime.    . cyclobenzaprine (FLEXERIL) 10 MG  tablet Take 0.5-1 tablets (5-10 mg total) by mouth 2 (two) times daily as needed for muscle spasms. (Patient not taking: Reported on 02/17/2014) 20 tablet 0  . traMADol (ULTRAM) 50 MG tablet Take 0.5-1 tablets (25-50 mg total) by mouth every 6 (six) hours as needed for moderate pain (3x a day maximum). (Patient not taking: Reported on 03/09/2014) 90 tablet 5   Objective: BP 148/68 mmHg  Temp(Src) 99 F (37.2 C)  Wt 187 lb (84.823 kg) Gen: NAD, resting comfortably CV: RRR no murmurs rubs or gallops Lungs: CTAB no crackles, wheeze, rhonchi Abdomen: soft/nontender/nondistended/normal bowel sounds.   Ext: 2+ equal pitting edema Skin: warm, dry, slight erythema/venous stasis changes over calves Stands slowly, walks with cane   Assessment/Plan:  BPH (benign prostatic hyperplasia) Stop terazosin given minimal improvement in symptoms. Start flomax.    Essential hypertension Mild poor control especially considering CKD. Already on Olmesartan 40mg , coreg 25mg  BID, amlodipine 10mg  (which would love to stop given edema). Increase lasix to BID due to continued edema, continue compression stockings. Stop terazosin given minimal BPH help and may add to edema though could add back if needed potentiallyfor BP help. Also consider spironolactone. HCTZ likely not as helpful with CKD Stage III though could consider.    Diastolic dysfunction Edema likely multifactorial. Fortunately no shortness of breath or crackles on exam. Increase lasix to BID to see if helps with edema and continue compression stockings. Stop terazosin as can cause edema  though with BP cant stop amlodipine really.     Return precautions advised. Patient did not want to return for at least 3 months but agreed to follow up within a month.   Meds ordered this encounter  Medications  . furosemide (LASIX) 40 MG tablet    Sig: Take 1 tablet (40 mg total) by mouth 2 (two) times daily. Separate doses by 6 hours.    Dispense:  60 tablet     Refill:  5  . tamsulosin (FLOMAX) 0.4 MG CAPS capsule    Sig: Take 1 capsule (0.4 mg total) by mouth daily.    Dispense:  30 capsule    Refill:  3

## 2014-03-09 NOTE — Assessment & Plan Note (Addendum)
Edema likely multifactorial. Fortunately no shortness of breath or crackles on exam. Increase lasix to BID to see if helps with edema and continue compression stockings. Stop terazosin as can cause edema though with BP cant stop amlodipine really.

## 2014-03-24 ENCOUNTER — Encounter: Payer: Self-pay | Admitting: Neurology

## 2014-03-24 ENCOUNTER — Ambulatory Visit (INDEPENDENT_AMBULATORY_CARE_PROVIDER_SITE_OTHER): Payer: Medicare Other | Admitting: Neurology

## 2014-03-24 VITALS — BP 165/74 | HR 81 | Temp 97.7°F | Resp 16 | Ht 64.0 in | Wt 180.0 lb

## 2014-03-24 DIAGNOSIS — M25552 Pain in left hip: Secondary | ICD-10-CM | POA: Diagnosis not present

## 2014-03-24 DIAGNOSIS — M7989 Other specified soft tissue disorders: Secondary | ICD-10-CM

## 2014-03-24 DIAGNOSIS — G253 Myoclonus: Secondary | ICD-10-CM

## 2014-03-24 NOTE — Patient Instructions (Signed)
We can monitor your symptoms. Exam is stable. Drink more water, walk regularly, using your cane. Talk to your primary doctor about you left hip pain.

## 2014-03-24 NOTE — Progress Notes (Signed)
Subjective:    Patient ID: John Hutchinson. is a 81 y.o. male.  HPI     Interim history:   John Hutchinson is a very pleasant 81 year old right-handed gentleman with an underlying medical history of hypertension, degenerative arthritis, hyperlipidemia and overweight state, who presents for follow-up consultation of his involuntary muscle jerking. The patient is accompanied by his wife again today. I first met him on 11/08/2013 at the referral from the emergency room where he presented on 11/08/2014 with new onset left upper extremity jerking particularly when he was trying to go to sleep. This was intermittent. It started fairly suddenly and lasted for hours. At his first visit he did not have any involuntary movements on my exam. I noted that his potassium had been borderline and I advised him to eat a banana regularly and elevate his legs because he had quite significant lower chimney swelling. He was advised to follow-up with his primary care physician for his significant leg edema and his low potassium. I suggested that we pursue a brain MRI with and without contrast to look for an organic cause of his myoclonus but he reported being very claustrophobic and wanted to hold off. I suggested we proceed with an EEG to look at an underlying electrophysiologic cause of myoclonus. I did not suggest any new medications other than continuation of Flexeril as needed but advised him about the sedating properties of the medication. He had an EEG on 11/16/2013 and I reviewed the results: This showed normal awake and drowsy EEG. We called him with his test results.  Today, 03/24/2014: He is able to provide a good history. His wife provides a little bit extra information as well. We talked about his EEG findings. We also talked about his blood work from 11/08/2014. I reviewed his echocardiogram which she had last month. He had mild diastolic dysfunction. He feels that he is doing fairly well with regards to his activity  level. He uses a cane outside the house. He tries to use his treadmill at least 5 minutes at a time. He used to be on it for 30 minutes but cannot do it any longer he feels. He has not had any more myoclonic or abnormal jerking. He still uses tramadol once or twice daily. He has left hip pain and it still bothers him.  Previously:   He had stopped taking tramadol which he felt may have been the cause of these involuntary movements. He was given Flexeril in the emergency room. He had a head CT without contrast on 11/07/2013 which I reviewed: 1. No acute intracranial process. 2. Mild to moderate chronic small vessel ischemic changes. In addition, I personally reviewed the images through the PACS system. He was given flexeril in the ER  Never had TIA or stroke symptoms, denying sudden onset of one sided weakness, numbness, tingling, slurring of speech or droopy face, hearing loss, tinnitus, diplopia or visual field cut or monocular loss of vision, and denies recurrent headaches.   He snores intermittently and mildly.   Flexeril at 10 mg bid has helped, but makes him sleepy.   His Past Medical History Is Significant For: Past Medical History  Diagnosis Date  . HYPERLIPIDEMIA 08/26/2006  . HYPERTENSION 08/26/2006  . PROSTATE SPECIFIC ANTIGEN, ELEVATED 06/24/2006    His Past Surgical History Is Significant For: Past Surgical History  Procedure Laterality Date  . Tonsillectomy    . Prostate biopsy    . Hernia repair      His  Family History Is Significant For: Family History  Problem Relation Age of Onset  . Stroke Mother     30s, smoker  . COPD Father     His Social History Is Significant For: History   Social History  . Marital Status: Married    Spouse Name: [name]  . Number of Children: 6  . Years of Education: 16   Occupational History  . retired    Social History Main Topics  . Smoking status: Former Smoker -- 1.00 packs/day for 3 years    Types: Cigarettes    Quit date:  01/15/1955  . Smokeless tobacco: Never Used  . Alcohol Use: No  . Drug Use: No  . Sexual Activity: Yes   Other Topics Concern  . Not on file   Social History Narrative   Married (wife patient of Dr. Yong Channel). 6 children. 3 grandkids.       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 Penicillins Other (See Comments)    unknown  :   His Current Medications Are:  Outpatient Encounter Prescriptions as of 03/24/2014  Medication Sig  . amLODipine (NORVASC) 10 MG tablet Take 1 tablet (10 mg total) by mouth daily.  . carvedilol (COREG) 25 MG tablet Take 25 mg by mouth 2 (two) times daily with a meal.  . cyclobenzaprine (FLEXERIL) 10 MG tablet Take 0.5-1 tablets (5-10 mg total) by mouth 2 (two) times daily as needed for muscle spasms.  . furosemide (LASIX) 40 MG tablet Take 1 tablet (40 mg total) by mouth 2 (two) times daily. Separate doses by 6 hours.  . Multiple Vitamin (MULTIVITAMIN) capsule Take 1 capsule by mouth daily.    Marland Kitchen olmesartan (BENICAR) 40 MG tablet Take 1 tablet (40 mg total) by mouth daily.  . rosuvastatin (CRESTOR) 10 MG tablet Take 10 mg by mouth every evening.  . tamsulosin (FLOMAX) 0.4 MG CAPS capsule Take 1 capsule (0.4 mg total) by mouth daily.  . traMADol (ULTRAM) 50 MG tablet Take 0.5-1 tablets (25-50 mg total) by mouth every 6 (six) hours as needed for moderate pain (3x a day maximum).  :  Review of Systems:  Out of a complete 14 point review of systems, all are reviewed and negative with the exception of these symptoms as listed below:   Review of Systems  All other systems reviewed and are negative.   Objective:  Neurologic Exam  Physical Exam Physical Examination:   Filed Vitals:   03/24/14 1303  BP: 165/74  Pulse: 81  Temp: 97.7 F (36.5 C)  Resp: 16    General Examination: The patient is a very pleasant 81 y.o. male in no acute distress.  He appears well-developed and well-nourished and well groomed.   HEENT: Normocephalic, atraumatic, pupils are equal, round and reactive to light and accommodation. Funduscopic exam is normal with sharp disc margins noted. Extraocular tracking is good without limitation to gaze excursion or nystagmus noted. Normal smooth pursuit is noted. Hearing is grossly intact. Tympanic membranes are clear bilaterally. Face is symmetric with normal facial animation and normal facial sensation. Speech is clear with no dysarthria noted. There is no hypophonia. There is no lip, neck/head, jaw or voice tremor. Neck is supple with full range of passive and active motion. There are no carotid bruits on auscultation. Oropharynx exam reveals: moderate mouth dryness, adequate dental hygiene and mild airway crowding. Mallampati  is class II. Tongue protrudes centrally and palate elevates symmetrically.    Chest: Clear to auscultation without wheezing, rhonchi or crackles noted.  Heart: S1+S2+0, regular and normal without murmurs, rubs or gallops noted, but he has had a couple of extra heartbeats.   Abdomen: Soft, non-tender and non-distended with normal bowel sounds appreciated on auscultation.  Extremities: There is 3+ pitting edema in the distal lower extremities bilaterally. Pedal pulses are intact.  Skin: Warm and dry without trophic changes noted. There are no varicose veins.  Musculoskeletal: exam reveals no obvious joint deformities, tenderness or joint swelling or erythema.   Neurologically:  Mental status: The patient is awake, alert and oriented in all 4 spheres. Immediate and remote memory, attention, language skills and fund of knowledge are appropriate. There is no evidence of aphasia, agnosia, apraxia or anomia. Speech is clear with normal prosody and enunciation. Thought process is linear. Mood is normal and affect is normal.  Cranial nerves II - XII are as described above under HEENT exam. In addition:  shoulder shrug is normal with equal shoulder height noted. Motor exam: Normal bulk, strength and tone is noted. There is no drift, tremor or rebound. Romberg is negative. Reflexes are 2+ in the upper extremities and trace in the lower extremities. I did not detect any myoclonus, tremors, or any other involuntary movements. Fine motor skills and coordination: intact with normal finger taps, normal hand movements, normal rapid alternating patting, normal foot taps and normal foot agility.  Cerebellar testing: No dysmetria or intention tremor on finger to nose testing. Heel to shin is difficult with the left secondary to hip discomfort, unchanged from last time. There is no truncal or gait ataxia.  Sensory exam: intact to light touch, pinprick, vibration, temperature sense and in the upper and lower extremities with the exception of decrease in vibration sense, likely d/t age and swelling.  Gait, station and balance: He stands with difficulty. No veering to one side is noted. No leaning to one side is noted. Posture is age-appropriate and stance is narrow based. Gait shows normal stride length and normal pace. No problems turning are noted. He uses his cane well, tandem walk is not possible as he cannot put weight on his L leg only. He has a slight limp and does endorse that his left hip has been bothering him.   Assessment and Plan:   In summary, Rylen Swindler. is a very pleasant 81 year old male with  an underlying medical history of hypertension, lower extremity edema, degenerative arthritis, and hyperlipidemia, who presents for follow-up consultation of his myoclonic jerking of the upper extremities. He has remained well in that regard. We did an EEG which was negative for any abnormal findings in the awake and drowsy states. His exam is largely stable. He may have some additional PVCs. He had an echocardiogram which we reviewed together. He is advised to talk to his primary care physician about ongoing  issues with his left hip. He may need a referral to orthopedics. He would like to avoid this if possible but is agreeable to talking to his primary care physician about ongoing decrease in range of motion, limping and pain and left hip. He is advised to use tramadol sparingly. He is advised to drink more water. He did not end up using Flexeril very much. He may have used it for 2 days. He feels well in that regard other than his left hip pain. At this juncture, I suggested we follow him  on an as-needed basis. He was in agreement. I answered all her questions today and the patient and his wife were in agreement.

## 2014-04-07 ENCOUNTER — Ambulatory Visit (INDEPENDENT_AMBULATORY_CARE_PROVIDER_SITE_OTHER): Payer: Medicare Other | Admitting: Family Medicine

## 2014-04-07 ENCOUNTER — Encounter: Payer: Self-pay | Admitting: Family Medicine

## 2014-04-07 VITALS — BP 150/70 | HR 75 | Temp 98.6°F | Wt 182.0 lb

## 2014-04-07 DIAGNOSIS — I519 Heart disease, unspecified: Secondary | ICD-10-CM

## 2014-04-07 DIAGNOSIS — I10 Essential (primary) hypertension: Secondary | ICD-10-CM

## 2014-04-07 DIAGNOSIS — N400 Enlarged prostate without lower urinary tract symptoms: Secondary | ICD-10-CM | POA: Diagnosis not present

## 2014-04-07 DIAGNOSIS — I5189 Other ill-defined heart diseases: Secondary | ICD-10-CM

## 2014-04-07 NOTE — Assessment & Plan Note (Signed)
Edema slightly improved. Continue BID lasix 40mg . Weight down 5 lbs from last visit. Consider checking BMET in follow up.

## 2014-04-07 NOTE — Progress Notes (Signed)
[name], MD Phone: [phone]  Subjective:   John Hutchinson. is a 81 y.o. year old very pleasant male patient who presents with the following:  BPH with nocturia-improved 4x a night on terazosin. Now down to about 2x a night, sometimes 3 but overall improved. Less urgency. Not wetting clothes anymore ROS- no dizziness with standing  Hypertension-mild poor control Diastolic dysfunction and edema-improving on BID lasix  BP Readings from Last 3 Encounters:  04/07/14 150/70  03/24/14 165/74  03/09/14 148/68   Home BP monitoring-usually 130s to 40s/70s at home  Compliant with medications-yes without side effects except every 2 hour urination ROS-Denies any CP, HA, SOB, blurry vision.   Past Medical History- Patient Active Problem List   Diagnosis Date Noted  . BPH (benign prostatic hyperplasia) 03/09/2014    Priority: Medium  . Diastolic dysfunction 54/00/8676    Priority: Medium  . CKD (chronic kidney disease), stage III 02/17/2014    Priority: Medium  . HYPERLIPIDEMIA 08/26/2006    Priority: Medium  . Essential hypertension 08/26/2006    Priority: Medium  . Degenerative arthritis 09/28/2012    Priority: Low  . PROSTATE SPECIFIC ANTIGEN, ELEVATED 06/24/2006    Priority: Low   Medications- reviewed and updated Current Outpatient Prescriptions  Medication Sig Dispense Refill  . amLODipine (NORVASC) 10 MG tablet Take 1 tablet (10 mg total) by mouth daily. 90 tablet 1  . carvedilol (COREG) 25 MG tablet Take 25 mg by mouth 2 (two) times daily with a meal.    . cyclobenzaprine (FLEXERIL) 10 MG tablet Take 0.5-1 tablets (5-10 mg total) by mouth 2 (two) times daily as needed for muscle spasms. (Patient not taking: Reported on 04/07/2014) 20 tablet 0  . furosemide (LASIX) 40 MG tablet Take 1 tablet (40 mg total) by mouth 2 (two) times daily. Separate doses by 6 hours. 60 tablet 5  . Multiple Vitamin (MULTIVITAMIN) capsule Take 1 capsule by mouth daily.      Marland Kitchen olmesartan  (BENICAR) 40 MG tablet Take 1 tablet (40 mg total) by mouth daily. 90 tablet 1  . rosuvastatin (CRESTOR) 10 MG tablet Take 10 mg by mouth every evening.    . tamsulosin (FLOMAX) 0.4 MG CAPS capsule Take 1 capsule (0.4 mg total) by mouth daily. 30 capsule 3  . traMADol (ULTRAM) 50 MG tablet Take 0.5-1 tablets (25-50 mg total) by mouth every 6 (six) hours as needed for moderate pain (3x a day maximum). (Patient not taking: Reported on 04/07/2014) 90 tablet 5   No current facility-administered medications for this visit.    Objective: BP 150/70 mmHg  Pulse 75  Temp(Src) 98.6 F (37 C)  Wt 182 lb (82.555 kg) Gen: NAD, resting comfortably CV: RRR no murmurs rubs or gallops Lungs: CTAB no crackles, wheeze, rhonchi Abdomen: soft/nontender/nondistended/normal bowel sounds. No rebound or guarding.  Ext: 2+ edema on Right (unchanged), 1+ on left. Weight down 5 lbs from last visit Skin: warm, dry, no rash   Assessment/Plan:  Essential hypertension Mild poor control in office but with several readings at home controlled. Continue Olmesartan 40mg , coreg 25mg  BID, amlodipine 10mg , lasix 40mg  BID. Still has edema so hopeful as we get extra fluid off, BP will come further down in office. HCTZ likely little help here. Spironolactone will decrease quality of life as already frequently urinating. Consider clonidine.    Diastolic dysfunction Edema slightly improved. Continue BID lasix 40mg . Weight down 5 lbs from last visit. Consider checking BMET in follow up.  BPH (benign prostatic hyperplasia) Nocturia cut in 1/2 some nights. He is pleased with progress on flomax instead of terazosin. Continue current medication.    6-8 week follow up.

## 2014-04-07 NOTE — Patient Instructions (Signed)
I am glad the peeing is better. Let's continue the flomax.   Blood pressure is still somewhat high but looks at home. Let's continue on the lasix and try to get some more fluid off and hopefully that will bring blood pressure on down.   Let's check in about 6 weeks from now to see how blood pressure and fluid on legs is doing.

## 2014-04-07 NOTE — Assessment & Plan Note (Signed)
Mild poor control in office but with several readings at home controlled. Continue Olmesartan 40mg , coreg 25mg  BID, amlodipine 10mg , lasix 40mg  BID. Still has edema so hopeful as we get extra fluid off, BP will come further down in office. HCTZ likely little help here. Spironolactone will decrease quality of life as already frequently urinating. Consider clonidine.

## 2014-04-07 NOTE — Assessment & Plan Note (Signed)
Nocturia cut in 1/2 some nights. He is pleased with progress on flomax instead of terazosin. Continue current medication.

## 2014-04-19 ENCOUNTER — Other Ambulatory Visit: Payer: Self-pay | Admitting: *Deleted

## 2014-04-19 MED ORDER — ROSUVASTATIN CALCIUM 10 MG PO TABS: 10.0000 mg | ORAL_TABLET | Freq: Every evening | ORAL | Status: DC

## 2014-05-23 DIAGNOSIS — H2513 Age-related nuclear cataract, bilateral: Secondary | ICD-10-CM | POA: Diagnosis not present

## 2014-06-14 ENCOUNTER — Encounter: Payer: Self-pay | Admitting: Family Medicine

## 2014-06-14 ENCOUNTER — Ambulatory Visit (INDEPENDENT_AMBULATORY_CARE_PROVIDER_SITE_OTHER): Payer: Medicare Other | Admitting: Family Medicine

## 2014-06-14 VITALS — BP 148/68 | HR 74 | Temp 99.4°F | Wt 183.0 lb

## 2014-06-14 DIAGNOSIS — I519 Heart disease, unspecified: Secondary | ICD-10-CM

## 2014-06-14 DIAGNOSIS — I10 Essential (primary) hypertension: Secondary | ICD-10-CM | POA: Diagnosis not present

## 2014-06-14 DIAGNOSIS — N400 Enlarged prostate without lower urinary tract symptoms: Secondary | ICD-10-CM | POA: Diagnosis not present

## 2014-06-14 DIAGNOSIS — I5032 Chronic diastolic (congestive) heart failure: Secondary | ICD-10-CM

## 2014-06-14 LAB — BASIC METABOLIC PANEL
BUN: 14 mg/dL (ref 6–23)
CHLORIDE: 108 meq/L (ref 96–112)
CO2: 30 meq/L (ref 19–32)
Calcium: 8.8 mg/dL (ref 8.4–10.5)
Creatinine, Ser: 1.39 mg/dL (ref 0.40–1.50)
GFR: 63.33 mL/min (ref >60.00)
Glucose, Bld: 121 mg/dL — ABNORMAL HIGH (ref 70–99)
Potassium: 3.7 mEq/L (ref 3.5–5.1)
SODIUM: 143 meq/L (ref 135–145)

## 2014-06-14 NOTE — Progress Notes (Signed)
John Reddish, MD  Subjective:  John Hutchinson. is a 81 y.o. year old very pleasant male patient who presents with:  Hypertension-mild poor control Diastolic CHF-  Controlled reasonably on lasix, suspect venous insufficiency contributing to edema BP Readings from Last 3 Encounters:  06/14/14 148/68  04/07/14 150/70  03/24/14 165/74   Home BP monitoring-no Compliant with medications-yes without side effects ROS-Denies any CP, HA, SOB, blurry vision, worsening LE edema  BPH with nocturia- improved on flomax -2x a night nocturia. Daytime polyuria much improved ROS- denies unintentional weight loss, bone pain  Past Medical History- HLD, CKD stage II, arthritis  Medications- reviewed and updated Current Outpatient Prescriptions  Medication Sig Dispense Refill  . amLODipine (NORVASC) 10 MG tablet Take 1 tablet (10 mg total) by mouth daily. 90 tablet 1  . carvedilol (COREG) 25 MG tablet Take 25 mg by mouth 2 (two) times daily with a meal.    . furosemide (LASIX) 40 MG tablet Take 1 tablet (40 mg total) by mouth 2 (two) times daily. Separate doses by 6 hours. 60 tablet 5  . Multiple Vitamin (MULTIVITAMIN) capsule Take 1 capsule by mouth daily.      Marland Kitchen olmesartan (BENICAR) 40 MG tablet Take 1 tablet (40 mg total) by mouth daily. 90 tablet 1  . rosuvastatin (CRESTOR) 10 MG tablet Take 1 tablet (10 mg total) by mouth every evening. 30 tablet 5  . tamsulosin (FLOMAX) 0.4 MG CAPS capsule Take 1 capsule (0.4 mg total) by mouth daily. 30 capsule 3  . cyclobenzaprine (FLEXERIL) 10 MG tablet Take 0.5-1 tablets (5-10 mg total) by mouth 2 (two) times daily as needed for muscle spasms. (Patient not taking: Reported on 06/14/2014) 20 tablet 0  . traMADol (ULTRAM) 50 MG tablet Take 0.5-1 tablets (25-50 mg total) by mouth every 6 (six) hours as needed for moderate pain (3x a day maximum). (Patient not taking: Reported on 04/07/2014) 90 tablet 5   Objective: BP 148/68 mmHg  Pulse 74  Temp(Src) 99.4 F  (37.4 C)  Wt 183 lb (83.008 kg) Gen: NAD, resting comfortably CV: RRR no murmurs rubs or gallops Lungs: CTAB no crackles, wheeze, rhonchi Abdomen: soft/nontender/nondistended/normal bowel sounds. No rebound or guarding.  Ext: 2+ edema on right persists, left also 2+ but less severe than right. Weight up 1 lb from last visit.  Skin: warm, dry, no rash Neuro: walks with cane, slow to rise   Assessment/Plan:  Essential hypertension Reasonable control on Olmesartan 40mg , coreg 25mg  BID, amlodipine 10mg , lasix 40mg  BID. Ideally < 140 with CKD and diastolic CHF but unfortunately do not think other options good options- worsened swelling reported on terazosin, hctz likely little help with CKD and may worsen urinary frequency, spironolactone likely also would worsen frequency. Still considering clonidine but hesitant in age and due to rebound.    BPH (benign prostatic hyperplasia) Much improved on flomax. Has known history of elevated PSA at 10 and reported biopsy though reportedly benign. Not sure of role of repeat PSA testing in age bracket but could consider repeat conversation next visit.    Diastolic CHF, chronic 2+ edema even on lasix 40mg  BID- likely some venous insufficiency as well. Continue therapy and add compression stockings   3-4 month follow up  Results for orders placed or performed in visit on 06/14/14 (from the past 24 hour(s))  Basic metabolic panel     Status: Abnormal   Collection Time: 06/14/14  2:27 PM  Result Value Ref Range   Sodium 143  135 - 145 mEq/L   Potassium 3.7 3.5 - 5.1 mEq/L   Chloride 108 96 - 112 mEq/L   CO2 30 19 - 32 mEq/L   Glucose, Bld 121 (H) 70 - 99 mg/dL   BUN 14 6 - 23 mg/dL   Creatinine, Ser 1.39 0.40 - 1.50 mg/dL   Calcium 8.8 8.4 - 10.5 mg/dL   GFR 63.33 >60.00 mL/min

## 2014-06-14 NOTE — Assessment & Plan Note (Signed)
Much improved on flomax. Has known history of elevated PSA at 10 and reported biopsy though reportedly benign. Not sure of role of repeat PSA testing in age bracket but could consider repeat conversation next visit.

## 2014-06-14 NOTE — Assessment & Plan Note (Signed)
2+ edema even on lasix 40mg  BID- likely some venous insufficiency as well. Continue therapy and add compression stockings

## 2014-06-14 NOTE — Patient Instructions (Addendum)
Elevate those legs. Wear compression stockings if able.   Continue lasix twice a day for now. Check in on kidney function today before you leave.   BP is still high but I think side effects of other medicines would not be desirable so we will hold off on changes as long as kidneys stable.   Glad the peeing is better!   3-4 months

## 2014-06-14 NOTE — Assessment & Plan Note (Signed)
Reasonable control on Olmesartan 40mg , coreg 25mg  BID, amlodipine 10mg , lasix 40mg  BID. Ideally < 140 with CKD and diastolic CHF but unfortunately do not think other options good options- worsened swelling reported on terazosin, hctz likely little help with CKD and may worsen urinary frequency, spironolactone likely also would worsen frequency. Still considering clonidine but hesitant in age and due to rebound.

## 2014-07-04 ENCOUNTER — Other Ambulatory Visit: Payer: Self-pay | Admitting: Family Medicine

## 2014-07-11 ENCOUNTER — Other Ambulatory Visit: Payer: Self-pay | Admitting: Family Medicine

## 2014-07-20 ENCOUNTER — Telehealth: Payer: Self-pay | Admitting: Family Medicine

## 2014-07-20 NOTE — Telephone Encounter (Signed)
Pt use to pay 35.00 for his medicine and now he has to pay 102.00   rosuvastatin (CRESTOR) 10 MG tablet  Pt is asking if there is something else he can try.     Pharmacy [facility] on [address]

## 2014-07-20 NOTE — Telephone Encounter (Signed)
See below

## 2014-07-21 MED ORDER — ATORVASTATIN CALCIUM 20 MG PO TABS: 20.0000 mg | ORAL_TABLET | Freq: Every day | ORAL | Status: DC

## 2014-07-21 NOTE — Telephone Encounter (Signed)
Change to atorvastatin 20mg  daily. 30 or 90 depending on patient preference.

## 2014-07-21 NOTE — Telephone Encounter (Signed)
Medication changed

## 2014-08-02 ENCOUNTER — Other Ambulatory Visit: Payer: Self-pay | Admitting: *Deleted

## 2014-08-02 MED ORDER — CARVEDILOL 25 MG PO TABS: 25.0000 mg | ORAL_TABLET | Freq: Two times a day (BID) | ORAL | Status: DC

## 2014-08-08 ENCOUNTER — Other Ambulatory Visit: Payer: Self-pay | Admitting: Family Medicine

## 2014-08-10 ENCOUNTER — Telehealth: Payer: Self-pay | Admitting: Family Medicine

## 2014-08-10 MED ORDER — VALSARTAN 320 MG PO TABS: 320.0000 mg | ORAL_TABLET | Freq: Every day | ORAL | Status: DC

## 2014-08-10 NOTE — Telephone Encounter (Signed)
See below

## 2014-08-10 NOTE — Telephone Encounter (Signed)
Pt can not afford benicar 40 mg cost for 90 day supply over 300.00 dollars. Pt would like something cheaper [facility]

## 2014-08-10 NOTE — Telephone Encounter (Signed)
Sent in valsartan as alternative. Patient needs to see me for blodo pressure recheck within the month.

## 2014-08-11 NOTE — Telephone Encounter (Signed)
Please get pt scheduled for a BP recheck within the month.

## 2014-08-11 NOTE — Telephone Encounter (Signed)
Pt answer after several rings and no ans machine

## 2014-09-12 ENCOUNTER — Other Ambulatory Visit: Payer: Self-pay | Admitting: Family Medicine

## 2014-10-17 ENCOUNTER — Ambulatory Visit (INDEPENDENT_AMBULATORY_CARE_PROVIDER_SITE_OTHER): Payer: Medicare Other | Admitting: Family Medicine

## 2014-10-17 ENCOUNTER — Encounter: Payer: Self-pay | Admitting: Family Medicine

## 2014-10-17 VITALS — BP 154/62 | HR 81 | Temp 99.5°F | Wt 184.0 lb

## 2014-10-17 DIAGNOSIS — I10 Essential (primary) hypertension: Secondary | ICD-10-CM | POA: Diagnosis not present

## 2014-10-17 DIAGNOSIS — B351 Tinea unguium: Secondary | ICD-10-CM | POA: Diagnosis not present

## 2014-10-17 MED ORDER — TRAMADOL HCL 50 MG PO TABS: 50.0000 mg | ORAL_TABLET | Freq: Four times a day (QID) | ORAL | Status: DC | PRN

## 2014-10-17 NOTE — Patient Instructions (Signed)
Your blood pressure trend concerns me. I would like for you to buy/use a home cuff to check at least 4x a week. Your goal is 145/90. If you note in the next few weeks that it is higher than our goal, see me sooner. Otherwise, see me in 4-6 weeks.   No change to medicine at this time  Send your nail sample to confirm fungus. If it is, likely treat with lamisil for 3 months  May update some labs at next visit

## 2014-10-17 NOTE — Progress Notes (Signed)
[name], MD  Subjective:  John Hutchinson. is a 80 y.o. year old very pleasant male patient who presents for/with See problem oriented charting ROS- no chest pain, shortness of breath. Minimal activity due to back pain  Past Medical History-  Patient Active Problem List   Diagnosis Date Noted  . Diastolic CHF, chronic ([place]) 03/09/2014    Priority: High  . BPH (benign prostatic hyperplasia) 03/09/2014    Priority: Medium  . HYPERLIPIDEMIA 08/26/2006    Priority: Medium  . Essential hypertension 08/26/2006    Priority: Medium  . CKD (chronic kidney disease), stage II 02/17/2014    Priority: Low  . Degenerative arthritis 09/28/2012    Priority: Low  . PROSTATE SPECIFIC ANTIGEN, ELEVATED 06/24/2006    Priority: Low   Medications- reviewed and updated Current Outpatient Prescriptions  Medication Sig Dispense Refill  . amLODipine (NORVASC) 10 MG tablet TAKE ONE TABLET BY MOUTH ONCE DAILY 90 tablet 2  . atorvastatin (LIPITOR) 20 MG tablet Take 1 tablet (20 mg total) by mouth daily. 90 tablet 3  . carvedilol (COREG) 25 MG tablet Take 1 tablet (25 mg total) by mouth 2 (two) times daily with a meal. 180 tablet 0  . furosemide (LASIX) 40 MG tablet TAKE ONE TABLET BY MOUTH TWICE DAILY---SEPARATE DOSES BY 6 HOURS 60 tablet 6  . Multiple Vitamin (MULTIVITAMIN) capsule Take 1 capsule by mouth daily.      . tamsulosin (FLOMAX) 0.4 MG CAPS capsule TAKE ONE CAPSULE BY MOUTH ONCE DAILY 30 capsule 5  . valsartan (DIOVAN) 320 MG tablet Take 1 tablet (320 mg total) by mouth daily. 30 tablet 5  . traMADol (ULTRAM) 50 MG tablet Take 1 tablet (50 mg total) by mouth every 6 (six) hours as needed for moderate pain (3x a day maximum). 90 tablet 5   No current facility-administered medications for this visit.    Objective: BP 154/62 mmHg  Pulse 81  Temp(Src) 99.5 F (37.5 C)  Wt 184 lb (83.462 kg) Gen: NAD, resting comfortably CV: RRR no murmurs rubs or gallops Lungs: CTAB no crackles,  wheeze, rhonchi Abdomen: soft/nontender/nondistended/normal bowel sounds. No rebound or guarding.  Ext: 2+ stable edema- see below for toenail exam Skin: warm, dry Neuro: grossly normal, moves all extremities  Assessment/Plan:  Essential hypertension S: poorly controlled on Valsartan 320mg , coreg 25mg  BID, amlodipine 10mg , lasix 40mg  BID. Recent change from benicar to valsartan. Home readings typically 140/70.  BP Readings from Last 3 Encounters:  10/17/14 154/62  06/14/14 148/68  04/07/14 150/70  A/P: mild elevation today over JNC 8 goal of 150/90 though with CHF and CKD (though really II) could consider lower goal. We will confirm home cuff next visit and patient will bring log in 4-6 weeks. If above 145/90 consider likely spironolactone vs. Clonidine as next step. Wonder if urinary symptoms of spironolactone would be too much.   Onychomycosis S: for several months has noted thickened toenails that now seem to turn in odd directions if he doesn't cut them. Thickening, yellow discoloration O: thickened yellow toenails bilaterally all 5 toes A/P: on exam- likely onychomycosis extensive. KOH tested then likely lamisil for 3 months if positive  Return precautions advised. 4-6 weeks.   Orders Placed This Encounter  Procedures  . KOH prep    Meds ordered this encounter  Medications  . traMADol (ULTRAM) 50 MG tablet    Sig: Take 1 tablet (50 mg total) by mouth every 6 (six) hours as needed for moderate pain (3x  a day maximum).    Dispense:  90 tablet    Refill:  5

## 2014-10-17 NOTE — Assessment & Plan Note (Signed)
S: poorly controlled on Valsartan 320mg , coreg 25mg  BID, amlodipine 10mg , lasix 40mg  BID. Recent change from benicar to valsartan. Home readings typically 140/70.  BP Readings from Last 3 Encounters:  10/17/14 154/62  06/14/14 148/68  04/07/14 150/70  A/P: mild elevation today over JNC 8 goal of 150/90 though with CHF and CKD (though really II) could consider lower goal. We will confirm home cuff next visit and patient will bring log in 4-6 weeks. If above 145/90 consider likely spironolactone vs. Clonidine as next step. Wonder if urinary symptoms of spironolactone would be too much.

## 2014-10-17 NOTE — Assessment & Plan Note (Signed)
Discussed today prior PSA of 8. Patient declines further PSA/prostate cancer testing. Understands risk of prostate cancer

## 2014-10-18 ENCOUNTER — Other Ambulatory Visit: Payer: Self-pay

## 2014-10-18 LAB — KOH PREP

## 2014-10-18 MED ORDER — TERBINAFINE HCL 250 MG PO TABS: 250.0000 mg | ORAL_TABLET | Freq: Every day | ORAL | Status: DC

## 2014-10-31 ENCOUNTER — Other Ambulatory Visit: Payer: Self-pay | Admitting: Family Medicine

## 2014-11-28 ENCOUNTER — Encounter: Payer: Self-pay | Admitting: Family Medicine

## 2014-11-28 ENCOUNTER — Ambulatory Visit (INDEPENDENT_AMBULATORY_CARE_PROVIDER_SITE_OTHER): Payer: Medicare Other | Admitting: Family Medicine

## 2014-11-28 VITALS — BP 136/62 | HR 86 | Temp 98.9°F | Wt 184.0 lb

## 2014-11-28 DIAGNOSIS — B351 Tinea unguium: Secondary | ICD-10-CM | POA: Diagnosis not present

## 2014-11-28 DIAGNOSIS — Z79899 Other long term (current) drug therapy: Secondary | ICD-10-CM

## 2014-11-28 DIAGNOSIS — I10 Essential (primary) hypertension: Secondary | ICD-10-CM | POA: Diagnosis not present

## 2014-11-28 LAB — COMPREHENSIVE METABOLIC PANEL
ALT: 11 U/L (ref 0–53)
AST: 14 U/L (ref 0–37)
Albumin: 3.8 g/dL (ref 3.5–5.2)
Alkaline Phosphatase: 71 U/L (ref 39–117)
BUN: 17 mg/dL (ref 6–23)
CHLORIDE: 107 meq/L (ref 96–112)
CO2: 31 meq/L (ref 19–32)
Calcium: 8.8 mg/dL (ref 8.4–10.5)
Creatinine, Ser: 1.4 mg/dL (ref 0.40–1.50)
GFR: 62.74 mL/min (ref >60.00)
Glucose, Bld: 151 mg/dL — ABNORMAL HIGH (ref 70–99)
Potassium: 3.6 mEq/L (ref 3.5–5.1)
SODIUM: 145 meq/L (ref 135–145)
Total Bilirubin: 0.4 mg/dL (ref 0.2–1.2)
Total Protein: 6.2 g/dL (ref 6.0–8.3)

## 2014-11-28 NOTE — Patient Instructions (Addendum)
Blood pressure looks ok on recheck. At home 75% of time it is less than 145/90 and 50% of time less than 140/90. Today was 136/62.   No changes in medicine given mostly controlled  Let's have you check 3x a week from now on and see me back if regularly >145/90 sooner, otherwise see me in 4 months. Bring your cuff to every meeting- we will check to see if it is accurate again next time  Check liver function tests and kidneys before you go since you are taking toenail fungus medicine

## 2014-11-28 NOTE — Assessment & Plan Note (Signed)
S: controlled. On Valsartan 320mg , coreg 25mg  BID, amlodipine 10mg , lasix 40mg  BID.  Home readings over last 24 days. 25% over 145 but minimally involved. 25% 140-145. 50% under 140.  BP Readings from Last 3 Encounters:  11/28/14 136/62  10/17/14 154/62  06/14/14 148/68  A/P:Continue current meds:  Improved control at office and pretty reasonable home control. We will continue to check in both places.

## 2014-11-28 NOTE — Progress Notes (Signed)
John Reddish, MD  Subjective:  John Mccalip. is a 80 y.o. year old very pleasant male patient who presents for/with See problem oriented charting ROS- No chest pain or shortness of breath. No headache or blurry vision.   Past Medical History-  Patient Active Problem List   Diagnosis Date Noted  . Diastolic CHF, chronic ([place]) 03/09/2014    Priority: High  . BPH (benign prostatic hyperplasia) 03/09/2014    Priority: Medium  . Degenerative arthritis 09/28/2012    Priority: Medium  . HYPERLIPIDEMIA 08/26/2006    Priority: Medium  . Essential hypertension 08/26/2006    Priority: Medium  . CKD (chronic kidney disease), stage II 02/17/2014    Priority: Low  . PROSTATE SPECIFIC ANTIGEN, ELEVATED 06/24/2006    Priority: Low    Medications- reviewed and updated Current Outpatient Prescriptions  Medication Sig Dispense Refill  . amLODipine (NORVASC) 10 MG tablet TAKE ONE TABLET BY MOUTH ONCE DAILY 90 tablet 2  . atorvastatin (LIPITOR) 20 MG tablet Take 1 tablet (20 mg total) by mouth daily. 90 tablet 3  . carvedilol (COREG) 25 MG tablet TAKE ONE TABLET BY MOUTH TWICE DAILY WITH A MEAL 180 tablet 3  . furosemide (LASIX) 40 MG tablet TAKE ONE TABLET BY MOUTH TWICE DAILY---SEPARATE DOSES BY 6 HOURS 60 tablet 6  . Multiple Vitamin (MULTIVITAMIN) capsule Take 1 capsule by mouth daily.      . tamsulosin (FLOMAX) 0.4 MG CAPS capsule TAKE ONE CAPSULE BY MOUTH ONCE DAILY 30 capsule 5  . terbinafine (LAMISIL) 250 MG tablet Take 1 tablet (250 mg total) by mouth daily. 30 tablet 2  . valsartan (DIOVAN) 320 MG tablet Take 1 tablet (320 mg total) by mouth daily. 30 tablet 5  . traMADol (ULTRAM) 50 MG tablet Take 1 tablet (50 mg total) by mouth every 6 (six) hours as needed for moderate pain (3x a day maximum). (Patient not taking: Reported on 11/28/2014) 90 tablet 5   No current facility-administered medications for this visit.    Objective: BP 136/62 mmHg  Pulse 86  Temp(Src) 98.9 F (37.2  C)  Wt 184 lb (83.462 kg) Gen: NAD, resting comfortably CV: RRR no murmurs rubs or gallops Lungs: CTAB no crackles, wheeze, rhonchi Abdomen: soft/nontender/nondistended/normal bowel sounds. No rebound or guarding.  Ext: 1+ stable edema Skin: warm, dry Neuro: grossly normal, moves all extremities  Did not reexamine foot  Assessment/Plan:  Essential hypertension S: controlled. On Valsartan 320mg , coreg 25mg  BID, amlodipine 10mg , lasix 40mg  BID.  Home readings over last 24 days. 25% over 145 but minimally involved. 25% 140-145. 50% under 140.  BP Readings from Last 3 Encounters:  11/28/14 136/62  10/17/14 154/62  06/14/14 148/68  A/P:Continue current meds:  Improved control at office and pretty reasonable home control. We will continue to check in both places.   Onychomycosis S: started terbinafine about 6 weeks ago. States is improving.  A/P: checked LFTs and normal- complete 12 week course. Examine after completion  Return precautions advised.   Results for orders placed or performed in visit on 11/28/14 (from the past 24 hour(s))  Comprehensive metabolic panel     Status: Abnormal   Collection Time: 11/28/14  3:23 PM  Result Value Ref Range   Sodium 145 135 - 145 mEq/L   Potassium 3.6 3.5 - 5.1 mEq/L   Chloride 107 96 - 112 mEq/L   CO2 31 19 - 32 mEq/L   Glucose, Bld 151 (H) 70 - 99 mg/dL   BUN  17 6 - 23 mg/dL   Creatinine, Ser 1.40 0.40 - 1.50 mg/dL   Total Bilirubin 0.4 0.2 - 1.2 mg/dL   Alkaline Phosphatase 71 39 - 117 U/L   AST 14 0 - 37 U/L   ALT 11 0 - 53 U/L   Total Protein 6.2 6.0 - 8.3 g/dL   Albumin 3.8 3.5 - 5.2 g/dL   Calcium 8.8 8.4 - 10.5 mg/dL   GFR 62.74 >60.00 mL/min

## 2015-01-02 ENCOUNTER — Other Ambulatory Visit: Payer: Self-pay | Admitting: Family Medicine

## 2015-02-06 ENCOUNTER — Other Ambulatory Visit: Payer: Self-pay | Admitting: Family Medicine

## 2015-03-28 ENCOUNTER — Encounter: Payer: Self-pay | Admitting: Family Medicine

## 2015-03-28 ENCOUNTER — Ambulatory Visit (INDEPENDENT_AMBULATORY_CARE_PROVIDER_SITE_OTHER): Payer: Medicare Other | Admitting: Family Medicine

## 2015-03-28 VITALS — BP 138/70 | HR 74 | Temp 98.4°F | Wt 194.0 lb

## 2015-03-28 DIAGNOSIS — I10 Essential (primary) hypertension: Secondary | ICD-10-CM | POA: Diagnosis not present

## 2015-03-28 DIAGNOSIS — I5032 Chronic diastolic (congestive) heart failure: Secondary | ICD-10-CM | POA: Diagnosis not present

## 2015-03-28 NOTE — Progress Notes (Signed)
John Reddish, MD  Subjective:  John Hutchinson. is a 82 y.o. year old very pleasant male patient who presents for/with See problem oriented charting ROS- No chest pain or shortness of breath. No headache or blurry vision. Does have chronic edema.  Past Medical History-  Patient Active Problem List   Diagnosis Date Noted  . Diastolic CHF, chronic ([place]) 03/09/2014    Priority: High  . BPH (benign prostatic hyperplasia) 03/09/2014    Priority: Medium  . Degenerative arthritis 09/28/2012    Priority: Medium  . HYPERLIPIDEMIA 08/26/2006    Priority: Medium  . Essential hypertension 08/26/2006    Priority: Medium  . CKD (chronic kidney disease), stage II 02/17/2014    Priority: Low  . PROSTATE SPECIFIC ANTIGEN, ELEVATED 06/24/2006    Priority: Low    Medications- reviewed and updated Current Outpatient Prescriptions  Medication Sig Dispense Refill  . amLODipine (NORVASC) 10 MG tablet TAKE ONE TABLET BY MOUTH ONCE DAILY 90 tablet 2  . atorvastatin (LIPITOR) 20 MG tablet Take 1 tablet (20 mg total) by mouth daily. 90 tablet 3  . carvedilol (COREG) 25 MG tablet TAKE ONE TABLET BY MOUTH TWICE DAILY WITH A MEAL 180 tablet 3  . furosemide (LASIX) 40 MG tablet TAKE ONE TABLET BY MOUTH TWICE DAILY---SEPARATE DOSES BY 6 HOURS 60 tablet 6  . Multiple Vitamin (MULTIVITAMIN) capsule Take 1 capsule by mouth daily.      . tamsulosin (FLOMAX) 0.4 MG CAPS capsule TAKE ONE CAPSULE BY MOUTH ONCE DAILY 30 capsule 5  . valsartan (DIOVAN) 320 MG tablet TAKE ONE TABLET BY MOUTH ONCE DAILY 30 tablet 5  . traMADol (ULTRAM) 50 MG tablet Take 1 tablet (50 mg total) by mouth every 6 (six) hours as needed for moderate pain (3x a day maximum). (Patient not taking: Reported on 11/28/2014) 90 tablet 5   No current facility-administered medications for this visit.    Objective: BP 138/70 mmHg  Pulse 74  Temp(Src) 98.4 F (36.9 C)  Wt 194 lb (87.998 kg) Gen: NAD, resting comfortably CV: RRR no murmurs  rubs or gallops Lungs: CTAB no crackles, wheeze, rhonchi Abdomen: soft/nontender/nondistended/normal bowel sounds. No rebound or guarding.  Ext: no edema Skin: warm, dry Neuro: grossly normal, moves all extremities, antalgic gait- walks with cane  Assessment/Plan:  Essential hypertension S: controlled on repeat on Valsartan 320mg , coreg 25mg  BID, amlodipine 10mg , lasix 40mg  BID . Home readings only 1/43 at 150. 20/43 between 140-150. 22/43 under 150 BP Readings from Last 3 Encounters:  03/28/15 138/70  11/28/14 136/62  10/17/14 154/62  A/P:Continue current meds:  Reasonable control. Ideally all readings under [id number] given diastolic CHF but patient on large load of medicine and will not adjust further as long as this reasonable.    Diastolic CHF, chronic S: Edema remain stable on Lasix 40 mg twice a day. No shortness of breath. No orthopnea or PND. Weight has crept up and patient admits to some dietary indiscretions A/P: We will continue to monitor. Patient is aware of need to follow-up if he develops other signs of heart failure or if his edema worsens    Return in about 6 months (around 09/28/2015) for annual wellness visit. Return precautions advised.

## 2015-03-28 NOTE — Assessment & Plan Note (Signed)
S: controlled on repeat on Valsartan 320mg , coreg 25mg  BID, amlodipine 10mg , lasix 40mg  BID . Home readings only 1/43 at 150. 20/43 between 140-150. 22/43 under 150 BP Readings from Last 3 Encounters:  03/28/15 138/70  11/28/14 136/62  10/17/14 154/62  A/P:Continue current meds:  Reasonable control. Ideally all readings under [id number] given diastolic CHF but patient on large load of medicine and will not adjust further as long as this reasonable.

## 2015-03-28 NOTE — Patient Instructions (Signed)
No changes to medication today.

## 2015-03-28 NOTE — Assessment & Plan Note (Signed)
S: Edema remain stable on Lasix 40 mg twice a day. No shortness of breath. No orthopnea or PND. Weight has crept up and patient admits to some dietary indiscretions A/P: We will continue to monitor. Patient is aware of need to follow-up if he develops other signs of heart failure or if his edema worsens

## 2015-04-03 ENCOUNTER — Other Ambulatory Visit: Payer: Self-pay | Admitting: Family Medicine

## 2015-04-10 ENCOUNTER — Other Ambulatory Visit: Payer: Self-pay | Admitting: Family Medicine

## 2015-05-18 ENCOUNTER — Other Ambulatory Visit: Payer: Self-pay | Admitting: Family Medicine

## 2015-05-18 NOTE — Telephone Encounter (Signed)
Refill ok? 

## 2015-05-18 NOTE — Telephone Encounter (Signed)
Yes thanks 

## 2015-07-03 ENCOUNTER — Other Ambulatory Visit: Payer: Self-pay | Admitting: Family Medicine

## 2015-07-03 NOTE — Telephone Encounter (Signed)
Rx refill sent to pharmacy. 

## 2015-07-13 ENCOUNTER — Other Ambulatory Visit: Payer: Self-pay | Admitting: Family Medicine

## 2015-08-03 ENCOUNTER — Other Ambulatory Visit: Payer: Self-pay | Admitting: Family Medicine

## 2015-08-08 ENCOUNTER — Other Ambulatory Visit: Payer: Self-pay | Admitting: Family Medicine

## 2015-08-08 NOTE — Telephone Encounter (Signed)
Yes thanks 

## 2015-09-04 ENCOUNTER — Other Ambulatory Visit: Payer: Self-pay | Admitting: Family Medicine

## 2015-09-14 ENCOUNTER — Other Ambulatory Visit: Payer: Self-pay

## 2015-09-18 ENCOUNTER — Other Ambulatory Visit: Payer: Self-pay | Admitting: Family Medicine

## 2015-09-19 NOTE — Telephone Encounter (Signed)
Yes thanks 

## 2015-09-20 ENCOUNTER — Other Ambulatory Visit: Payer: Self-pay | Admitting: Family Medicine

## 2015-09-28 ENCOUNTER — Other Ambulatory Visit: Payer: Self-pay | Admitting: Family Medicine

## 2015-09-28 NOTE — Telephone Encounter (Signed)
Rx refill sent to pharmacy. 

## 2015-09-29 ENCOUNTER — Ambulatory Visit (INDEPENDENT_AMBULATORY_CARE_PROVIDER_SITE_OTHER): Payer: Medicare Other | Admitting: Family Medicine

## 2015-09-29 ENCOUNTER — Encounter: Payer: Self-pay | Admitting: Family Medicine

## 2015-09-29 VITALS — BP 174/64 | HR 74 | Temp 98.4°F | Ht 64.0 in | Wt 186.6 lb

## 2015-09-29 DIAGNOSIS — N400 Enlarged prostate without lower urinary tract symptoms: Secondary | ICD-10-CM | POA: Diagnosis not present

## 2015-09-29 DIAGNOSIS — M159 Polyosteoarthritis, unspecified: Secondary | ICD-10-CM

## 2015-09-29 DIAGNOSIS — I5032 Chronic diastolic (congestive) heart failure: Secondary | ICD-10-CM

## 2015-09-29 DIAGNOSIS — Z0000 Encounter for general adult medical examination without abnormal findings: Secondary | ICD-10-CM | POA: Diagnosis not present

## 2015-09-29 DIAGNOSIS — E785 Hyperlipidemia, unspecified: Secondary | ICD-10-CM

## 2015-09-29 DIAGNOSIS — I10 Essential (primary) hypertension: Secondary | ICD-10-CM | POA: Diagnosis not present

## 2015-09-29 DIAGNOSIS — M150 Primary generalized (osteo)arthritis: Secondary | ICD-10-CM

## 2015-09-29 DIAGNOSIS — Z7189 Other specified counseling: Secondary | ICD-10-CM

## 2015-09-29 LAB — COMPREHENSIVE METABOLIC PANEL
ALK PHOS: 71 U/L (ref 39–117)
ALT: 13 U/L (ref 0–53)
AST: 16 U/L (ref 0–37)
Albumin: 3.8 g/dL (ref 3.5–5.2)
BUN: 17 mg/dL (ref 6–23)
CHLORIDE: 105 meq/L (ref 96–112)
CO2: 32 mEq/L (ref 19–32)
Calcium: 8.6 mg/dL (ref 8.4–10.5)
Creatinine, Ser: 1.28 mg/dL (ref 0.40–1.50)
GFR: 69.42 mL/min (ref >60.00)
GLUCOSE: 82 mg/dL (ref 70–99)
POTASSIUM: 3.6 meq/L (ref 3.5–5.1)
SODIUM: 145 meq/L (ref 135–145)
TOTAL PROTEIN: 6.6 g/dL (ref 6.0–8.3)
Total Bilirubin: 0.5 mg/dL (ref 0.2–1.2)

## 2015-09-29 LAB — LIPID PANEL
Cholesterol: 174 mg/dL (ref 0–200)
HDL: 41.1 mg/dL (ref >39.00)
LDL CALC: 112 mg/dL — AB (ref 0–99)
NONHDL: 133.08
Total CHOL/HDL Ratio: 4
Triglycerides: 104 mg/dL (ref 0.0–149.0)
VLDL: 20.8 mg/dL (ref 0.0–40.0)

## 2015-09-29 LAB — CBC
HEMATOCRIT: 40 % (ref 39.0–52.0)
HEMOGLOBIN: 13.2 g/dL (ref 13.0–17.0)
MCHC: 33 g/dL (ref 30.0–36.0)
MCV: 90.5 fl (ref 78.0–100.0)
Platelets: 243 10*3/uL (ref 150.0–400.0)
RBC: 4.42 Mil/uL (ref 4.22–5.81)
RDW: 13.7 % (ref 11.5–15.5)
WBC: 6 10*3/uL (ref 4.0–10.5)

## 2015-09-29 MED ORDER — FUROSEMIDE 40 MG PO TABS: ORAL_TABLET | ORAL | 3 refills | Status: DC

## 2015-09-29 MED ORDER — TAMSULOSIN HCL 0.4 MG PO CAPS: 0.4000 mg | ORAL_CAPSULE | Freq: Every day | ORAL | 3 refills | Status: DC

## 2015-09-29 NOTE — Progress Notes (Signed)
Pre visit review using our clinic review tool, if applicable. No additional management support is needed unless otherwise documented below in the visit note. 

## 2015-09-29 NOTE — Assessment & Plan Note (Signed)
S: known BPH stable on flomax. Prior elevated psa- had biopsy 2008 and does not want again. Nocturia 15x a night up to previously, now down to above 5 on flmoax A/P: continue current medication- flomax

## 2015-09-29 NOTE — Assessment & Plan Note (Signed)
S: L hip and knee bothersome- continues tramadol prn 1-3 x a day, averages 2 A/P: continue tramadol prn and continue cane use

## 2015-09-29 NOTE — Progress Notes (Addendum)
Phone: [phone]  Subjective:  Patient presents today for their annual wellness visit.    Preventive Screening-Counseling & Management  Smoking Status: former Smoker- 8 pack years quit 1963 Second Hand Smoking status: No smokers in home  Risk Factors Regular exercise: walking around house everyday for 5-10 minutes Diet:  2 veggies with dinner, usually a meat. Sandwiches for lunch for most of time- sometimes cereal Fall Risk: None  Fall Risk  09/29/2015 04/07/2014 03/01/2013 09/28/2012  Falls in the past year? No No No No  Risk for fall due to : - - History of fall(s) -    Cardiac risk factors:  advanced age (older than 23 for men, 58 for women)  Hyperlipidemia - controlled in past No diabetes.  HTN- usually controlled but did not take meds yet   Depression Screen None. PHQ2 0  Depression screen [facility] 2/9 09/29/2015 04/07/2014 03/01/2013 09/28/2012 05/29/2011  Decreased Interest 0 0 0 0 1  Down, Depressed, Hopeless 0 0 1 0 1  PHQ - 2 Score 0 0 1 0 2    Activities of Daily Living Independent ADLs and IADLs wife does most of shopping, wife manages finances. He cooks his own breakfast and prepares his lunch.   Hearing Difficulties: -patient declines  Cognitive Testing No reported trouble.   Normal 3 word recall  List the Names of Other Physician/Practitioners you currently use: -Dr. Nicole Kindred neurology -eye doctor- cannot recall name- [facility] though  Immunization History  Administered Date(s) Administered  . Pneumococcal Conjugate-13 03/01/2013  . Pneumococcal Polysaccharide-23 01/14/2005  . Td 01/14/1998, 03/01/2013   Required Immunizations needed today : declines flu shot  Screening tests- up to date There are no preventive care reminders to display for this patient.  ROS- No pertinent positives discovered in course of AWV Ros- No chest pain or shortness of breath. No headache or blurry vision.   The following were reviewed and entered/updated  in epic: Past Medical History:  Diagnosis Date  . HYPERLIPIDEMIA 08/26/2006  . HYPERTENSION 08/26/2006  . PROSTATE SPECIFIC ANTIGEN, ELEVATED 06/24/2006   Patient Active Problem List   Diagnosis Date Noted  . Diastolic CHF, chronic ([place]) 03/09/2014    Priority: High  . BPH (benign prostatic hyperplasia) 03/09/2014    Priority: Medium  . Degenerative arthritis 09/28/2012    Priority: Medium  . Hyperlipidemia 08/26/2006    Priority: Medium  . Essential hypertension 08/26/2006    Priority: Medium  . CKD (chronic kidney disease), stage II 02/17/2014    Priority: Low  . PROSTATE SPECIFIC ANTIGEN, ELEVATED 06/24/2006    Priority: Low  . Goals of care, counseling/discussion 09/29/2015   Past Surgical History:  Procedure Laterality Date  . HERNIA REPAIR    . PROSTATE BIOPSY    . TONSILLECTOMY      Family History  Problem Relation Age of Onset  . Stroke Mother     12s, smoker  . COPD Father     Medications- reviewed and updated Current Outpatient Prescriptions  Medication Sig Dispense Refill  . amLODipine (NORVASC) 10 MG tablet TAKE ONE TABLET BY MOUTH ONCE DAILY 90 tablet 3  . atorvastatin (LIPITOR) 20 MG tablet TAKE ONE TABLET BY MOUTH ONCE DAILY 90 tablet 0  . carvedilol (COREG) 25 MG tablet TAKE ONE TABLET BY MOUTH TWICE DAILY WITH A MEAL 180 tablet 3  . furosemide (LASIX) 40 MG tablet TAKE ONE TABLET BY MOUTH TWICE DAILY. SEPARATE DOSES BY 6 HOURS. 180 tablet 3  . Multiple Vitamin (MULTIVITAMIN) capsule Take  1 capsule by mouth daily.      . tamsulosin (FLOMAX) 0.4 MG CAPS capsule Take 1 capsule (0.4 mg total) by mouth daily. 90 capsule 3  . traMADol (ULTRAM) 50 MG tablet TAKE ONE TABLET BY MOUTH EVERY 6 HOURS AS NEEDED FOR MODERATE PAIN (MAXIMUM OF 3 TIMES A DAY). 90 tablet 1  . valsartan (DIOVAN) 320 MG tablet TAKE ONE TABLET BY MOUTH ONCE DAILY 30 tablet 5   No current facility-administered medications for this visit.     Allergies-reviewed and updated Allergies    Allergen Reactions  . Penicillins Other (See Comments)    unknown    Social History   Social History  . Marital status: Married    Spouse name: [name]  . Number of children: 6  . Years of education: 30   Occupational History  . retired    Social History Main Topics  . Smoking status: Former Smoker    Packs/day: 1.00    Years: 3.00    Types: Cigarettes    Quit date: 01/15/1955  . Smokeless tobacco: Never Used  . Alcohol use No  . Drug use: No  . Sexual activity: Yes   Other Topics Concern  . None   Social History Narrative   Married (wife patient of Dr. Yong Channel). 6 children. 3 grandkids.       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 Oropharynx normal Neck: no thyromegaly CV: RRR no murmurs rubs or gallops Lungs: CTAB no crackles, wheeze, rhonchi Abdomen: soft/nontender/nondistended/normal bowel sounds. No rebound or guarding.  Ext: no edema Skin: warm, dry Neuro: grossly normal, moves all extremities, PERRLA  Assessment/Plan:  AWV completed- discussed recommended screenings anddocumented any personalized health advice and referrals for preventive counseling. See AVS as well which was given to patient.   Status of chronic or acute concerns   Diastolic CHF, chronic S: 2+ edema even on lasix 40mg  BID so likely some venous insufficiency as well Wt Readings from Last 3 Encounters:  09/29/15 186 lb 9.6 oz (84.6 kg)  03/28/15 194 lb (88 kg)  11/28/14 184 lb (83.5 kg)  A/P:  Weight back to stable around his 185 baseline, continue lasix 40 BID  Hyperlipidemia S: previously controlled on  atorvastatin 20mg  A/P:update lipids today with LDL goal <100 at least. Although benefit of primary prevention unclear at his age- he has tolerated well so will continue  Essential hypertension S: controlled when on BP meds- he did not take any of them yet today (Valsartan 320mg , coreg 25mg  BID, amlodipine 10mg , lasix 40mg  BID ). Discussed even when fasting can take meds with water BP Readings from Last 3 Encounters:  09/29/15 (!) 174/64  03/28/15 138/70  11/28/14 136/62  A/P:Continue current meds:  Doing well in past- repeat at follow up  Degenerative arthritis S: L hip and knee bothersome- continues tramadol prn 1-3 x a day, averages 2 A/P: continue tramadol prn and continue cane use  BPH (benign prostatic hyperplasia) S: known BPH stable on flomax. Prior elevated psa- had biopsy 2008 and does not  want again. Nocturia 15x a night up to previously, now down to above 5 on flmoax A/P: continue current medication- flomax  Goals of care, counseling/discussion Never had colonoscopy- does not want to even do stool cards. He understands cancer risk. Similar to perspective on prior elevated PSA- he wants no work up. He feels he has lived a long life and would not want to prolong it if he had cancer- would not want intervention other than for comfort.    Return in about 4 months (around 01/29/2016).    Orders Placed This Encounter  Procedures  . CBC    [place]  . Comprehensive metabolic panel    [place]    Order Specific Question:   Has the patient fasted?    Answer:   No  . Lipid panel    [place]    Order Specific Question:   Has the patient fasted?    Answer:   No    Meds ordered this encounter  Medications  . tamsulosin (FLOMAX) 0.4 MG CAPS capsule    Sig: Take 1 capsule (0.4 mg total) by mouth daily.    Dispense:  90 capsule    Refill:  3  . furosemide (LASIX) 40 MG tablet    Sig: TAKE ONE TABLET BY MOUTH TWICE DAILY. SEPARATE DOSES BY 6 HOURS.    Dispense:  180 tablet     Refill:  3    Return precautions advised.  [name], MD

## 2015-09-29 NOTE — Patient Instructions (Signed)
  [name] , Thank you for taking time to come for your Medicare Wellness Visit. I appreciate your ongoing commitment to your health goals. Please review the following plan we discussed and let me know if I can assist you in the future.   These are the goals we discussed: 1. Labs before you go 2. You declined flu shot 3. I will make a note about your wishes regarding cancer screening workup. I appreciate your perspective on this 4. Continue your walking   This is a list of the screening recommended for you and due dates:  Health Maintenance  Topic Date Due  . Flu Shot  09/29/2022*  . Shingles Vaccine  09/29/2022*  . Tetanus Vaccine  03/02/2023  . Pneumonia vaccines  Completed  *Topic was postponed. The date shown is not the original due date.

## 2015-09-29 NOTE — Assessment & Plan Note (Addendum)
S: previously controlled on atorvastatin 20mg  A/P:update lipids today with LDL goal <100 at least. Although benefit of primary prevention unclear at his age- he has tolerated well so will continue

## 2015-09-29 NOTE — Assessment & Plan Note (Signed)
S: 2+ edema even on lasix 40mg  BID so likely some venous insufficiency as well Wt Readings from Last 3 Encounters:  09/29/15 186 lb 9.6 oz (84.6 kg)  03/28/15 194 lb (88 kg)  11/28/14 184 lb (83.5 kg)  A/P:  Weight back to stable around his 185 baseline, continue lasix 40 BID

## 2015-09-29 NOTE — Assessment & Plan Note (Signed)
S: controlled when on BP meds- he did not take any of them yet today (Valsartan 320mg , coreg 25mg  BID, amlodipine 10mg , lasix 40mg  BID ). Discussed even when fasting can take meds with water BP Readings from Last 3 Encounters:  09/29/15 (!) 174/64  03/28/15 138/70  11/28/14 136/62  A/P:Continue current meds:  Doing well in past- repeat at follow up

## 2015-09-29 NOTE — Assessment & Plan Note (Signed)
Never had colonoscopy- does not want to even do stool cards. He understands cancer risk. Similar to perspective on prior elevated PSA- he wants no work up. He feels he has lived a long life and would not want to prolong it if he had cancer- would not want intervention other than for comfort.

## 2015-10-16 ENCOUNTER — Other Ambulatory Visit: Payer: Self-pay | Admitting: Family Medicine

## 2015-10-25 ENCOUNTER — Other Ambulatory Visit: Payer: Self-pay | Admitting: Family Medicine

## 2015-12-04 ENCOUNTER — Other Ambulatory Visit: Payer: Self-pay | Admitting: Family Medicine

## 2016-02-19 ENCOUNTER — Other Ambulatory Visit: Payer: Self-pay | Admitting: Family Medicine

## 2016-02-26 ENCOUNTER — Other Ambulatory Visit: Payer: Self-pay | Admitting: Family Medicine

## 2016-03-28 ENCOUNTER — Ambulatory Visit (INDEPENDENT_AMBULATORY_CARE_PROVIDER_SITE_OTHER): Payer: Medicare Other | Admitting: Family Medicine

## 2016-03-28 ENCOUNTER — Encounter: Payer: Self-pay | Admitting: Family Medicine

## 2016-03-28 VITALS — BP 138/62 | HR 73 | Temp 97.8°F | Ht 64.0 in | Wt 185.8 lb

## 2016-03-28 DIAGNOSIS — R350 Frequency of micturition: Secondary | ICD-10-CM | POA: Diagnosis not present

## 2016-03-28 DIAGNOSIS — N401 Enlarged prostate with lower urinary tract symptoms: Secondary | ICD-10-CM

## 2016-03-28 DIAGNOSIS — I5032 Chronic diastolic (congestive) heart failure: Secondary | ICD-10-CM

## 2016-03-28 DIAGNOSIS — E785 Hyperlipidemia, unspecified: Secondary | ICD-10-CM | POA: Diagnosis not present

## 2016-03-28 DIAGNOSIS — I10 Essential (primary) hypertension: Secondary | ICD-10-CM

## 2016-03-28 LAB — BASIC METABOLIC PANEL
BUN: 16 mg/dL (ref 6–23)
CALCIUM: 9 mg/dL (ref 8.4–10.5)
CO2: 31 mEq/L (ref 19–32)
CREATININE: 1.23 mg/dL (ref 0.40–1.50)
Chloride: 105 mEq/L (ref 96–112)
GFR: 72.6 mL/min (ref >60.00)
GLUCOSE: 104 mg/dL — AB (ref 70–99)
Potassium: 3.9 mEq/L (ref 3.5–5.1)
Sodium: 145 mEq/L (ref 135–145)

## 2016-03-28 LAB — LDL CHOLESTEROL, DIRECT: Direct LDL: 104 mg/dL

## 2016-03-28 NOTE — Progress Notes (Signed)
Subjective:  John Monforte. is a 83 y.o. year old very pleasant male patient who presents for/with See problem oriented charting ROS- No chest pain or shortness of breath. No headache or blurry vision. Chronic stable edema   Past Medical History-  Patient Active Problem List   Diagnosis Date Noted  . Diastolic CHF, chronic ([place]) 03/09/2014    Priority: High  . BPH (benign prostatic hyperplasia) 03/09/2014    Priority: Medium  . Degenerative arthritis 09/28/2012    Priority: Medium  . Hyperlipidemia 08/26/2006    Priority: Medium  . Essential hypertension 08/26/2006    Priority: Medium  . CKD (chronic kidney disease), stage II 02/17/2014    Priority: Low  . PROSTATE SPECIFIC ANTIGEN, ELEVATED 06/24/2006    Priority: Low  . Goals of care, counseling/discussion 09/29/2015    Medications- reviewed and updated Current Outpatient Prescriptions  Medication Sig Dispense Refill  . amLODipine (NORVASC) 10 MG tablet TAKE ONE TABLET BY MOUTH ONCE DAILY 90 tablet 3  . atorvastatin (LIPITOR) 20 MG tablet TAKE ONE TABLET BY MOUTH ONCE DAILY 90 tablet 3  . carvedilol (COREG) 25 MG tablet TAKE ONE TABLET BY MOUTH TWICE DAILY WITH A MEAL 180 tablet 3  . furosemide (LASIX) 40 MG tablet TAKE ONE TABLET BY MOUTH TWICE DAILY. SEPARATE DOSES BY 6 HOURS. 180 tablet 3  . Multiple Vitamin (MULTIVITAMIN) capsule Take 1 capsule by mouth daily.      . tamsulosin (FLOMAX) 0.4 MG CAPS capsule Take 1 capsule (0.4 mg total) by mouth daily. 90 capsule 3  . traMADol (ULTRAM) 50 MG tablet TAKE ONE TABLET BY MOUTH EVERY 6 HOURS AS NEEDED FOR PAIN (MAX OF 3 TIMES A DAY) 90 tablet 1  . valsartan (DIOVAN) 320 MG tablet TAKE ONE TABLET BY MOUTH ONCE DAILY 30 tablet 5   No current facility-administered medications for this visit.     Objective: BP 138/62   Pulse 73   Temp 97.8 F (36.6 C) (Oral)   Ht 5\' 4"  (1.626 m)   Wt 185 lb 12.8 oz (84.3 kg)   SpO2 98%   BMI 31.89 kg/m  Gen: NAD, resting comfortably,  appears stated age CV: RRR no murmurs rubs or gallops Lungs: CTAB no crackles, wheeze, rhonchi Abdomen: obese Ext: 2+ pitting edema Skin: warm, dry Walks with cane  Assessment/Plan:   Diastolic CHF, chronic S: compliant with lasix 40mg  BID. Edema stable at 2+.weight stable around 185. No increased SOB.  A/P: doing well- update bmet. 6 month follow up  Hyperlipidemia S: well controlled on atorvastatin 20mg  in past but slight bump to 112 last time on ldl. No myalgias.  A/P: unclear benefit/risk primary prevention at his age but has tolerated med well so will continue. Update direct ldl- had bumped up last visit  Essential hypertension S: controlled on valsartan 320 mg, coreg 25mg  BID, amlodipine 10mg , lasix 40mg  BID.  BP Readings from Last 3 Encounters:  03/28/16 138/62  09/29/15 (!) 174/64  03/28/15 138/70  A/P:Continue current medications- much improved today- was off medicine last visit with elevation  BPH (benign prostatic hyperplasia) S: flomax has been a benefit for him. Known BPH- does not want biopsy again or PSA testing.   Nocturia still around 4x a night down from 15 prior to flomax. About every 2 hours in the day- much better than it was before though.  A/P: he is very pleased with results- continue flomax. Does not want to look into any potential prostate cancer once again  6 months verbal  bmet primarily due to BID lasix Orders Placed This Encounter  Procedures  . Basic metabolic panel    [place]  . LDL cholesterol, direct    [place]   Return precautions advised.  [name], MD

## 2016-03-28 NOTE — Assessment & Plan Note (Signed)
S: compliant with lasix 40mg  BID. Edema stable at 2+.weight stable around 185. No increased SOB.  A/P: doing well- update bmet. 6 month follow up

## 2016-03-28 NOTE — Patient Instructions (Signed)
Please stop by lab before you go  No changes today   

## 2016-03-28 NOTE — Assessment & Plan Note (Signed)
S: controlled on valsartan 320 mg, coreg 25mg  BID, amlodipine 10mg , lasix 40mg  BID.  BP Readings from Last 3 Encounters:  03/28/16 138/62  09/29/15 (!) 174/64  03/28/15 138/70  A/P:Continue current medications- much improved today- was off medicine last visit with elevation

## 2016-03-28 NOTE — Assessment & Plan Note (Addendum)
S: well controlled on atorvastatin 20mg  in past but slight bump to 112 last time on ldl. No myalgias.  A/P: unclear benefit/risk primary prevention at his age but has tolerated med well so will continue. Update direct ldl- had bumped up last visit

## 2016-03-28 NOTE — Assessment & Plan Note (Signed)
S: flomax has been a benefit for him. Known BPH- does not want biopsy again or PSA testing.   Nocturia still around 4x a night down from 15 prior to flomax. About every 2 hours in the day- much better than it was before though.  A/P: he is very pleased with results- continue flomax. Does not want to look into any potential prostate cancer once again

## 2016-03-28 NOTE — Progress Notes (Signed)
Pre visit review using our clinic review tool, if applicable. No additional management support is needed unless otherwise documented below in the visit note. 

## 2016-04-04 ENCOUNTER — Other Ambulatory Visit: Payer: Self-pay | Admitting: Family Medicine

## 2016-05-15 ENCOUNTER — Telehealth: Payer: Self-pay | Admitting: Family Medicine

## 2016-05-15 NOTE — Telephone Encounter (Signed)
Pt needs new refill tramadol send to [facility] in [place] [address]

## 2016-05-16 ENCOUNTER — Other Ambulatory Visit: Payer: Self-pay

## 2016-05-16 MED ORDER — TRAMADOL HCL 50 MG PO TABS: ORAL_TABLET | ORAL | 1 refills | Status: DC

## 2016-05-16 NOTE — Telephone Encounter (Signed)
Prescription called in as requested.

## 2016-06-04 ENCOUNTER — Other Ambulatory Visit: Payer: Self-pay

## 2016-06-04 MED ORDER — VALSARTAN 320 MG PO TABS: 320.0000 mg | ORAL_TABLET | Freq: Every day | ORAL | 3 refills | Status: DC

## 2016-07-31 ENCOUNTER — Telehealth: Payer: Self-pay | Admitting: Family Medicine

## 2016-07-31 NOTE — Telephone Encounter (Signed)
Can we try olmesartan 40mg - had fallen off formulary a few years ago but want to see if covered again. If it is not- may use losartan 100mg . Each daily dosing #90 and 1 refill.

## 2016-07-31 NOTE — Telephone Encounter (Signed)
Pt is calling to let md know the valsartan has been recalled. Pt would like new rx 90 day supply send to [facility] [place] [address]

## 2016-08-02 ENCOUNTER — Other Ambulatory Visit: Payer: Self-pay

## 2016-08-02 MED ORDER — OLMESARTAN MEDOXOMIL 40 MG PO TABS: 40.0000 mg | ORAL_TABLET | Freq: Every day | ORAL | 1 refills | Status: DC

## 2016-08-02 NOTE — Telephone Encounter (Signed)
Patient is following up specifically about his Valsartan. States that he spoke to the pharmacy this morning and they have not heard from our office.

## 2016-08-02 NOTE — Telephone Encounter (Signed)
I sent olmesartan in to the pharmacy.

## 2016-08-06 ENCOUNTER — Other Ambulatory Visit: Payer: Self-pay

## 2016-08-06 MED ORDER — TRAMADOL HCL 50 MG PO TABS: ORAL_TABLET | ORAL | 1 refills | Status: DC

## 2016-08-12 ENCOUNTER — Telehealth: Payer: Self-pay | Admitting: Family Medicine

## 2016-08-12 NOTE — Telephone Encounter (Signed)
Pt calling state that the pharmacy has not received the Rx (tramadol) and would like to see if it can be resent.    Pharm: [address] [place], [place]

## 2016-08-12 NOTE — Telephone Encounter (Signed)
Prescription called in to pharmacy. Spoke with pharmacist Wells Guiles

## 2016-09-26 ENCOUNTER — Encounter: Payer: Self-pay | Admitting: Family Medicine

## 2016-09-26 ENCOUNTER — Ambulatory Visit (INDEPENDENT_AMBULATORY_CARE_PROVIDER_SITE_OTHER): Payer: Medicare Other | Admitting: Family Medicine

## 2016-09-26 DIAGNOSIS — I10 Essential (primary) hypertension: Secondary | ICD-10-CM | POA: Diagnosis not present

## 2016-09-26 DIAGNOSIS — N401 Enlarged prostate with lower urinary tract symptoms: Secondary | ICD-10-CM | POA: Diagnosis not present

## 2016-09-26 DIAGNOSIS — R350 Frequency of micturition: Secondary | ICD-10-CM

## 2016-09-26 DIAGNOSIS — I5032 Chronic diastolic (congestive) heart failure: Secondary | ICD-10-CM | POA: Diagnosis not present

## 2016-09-26 DIAGNOSIS — E785 Hyperlipidemia, unspecified: Secondary | ICD-10-CM | POA: Diagnosis not present

## 2016-09-26 LAB — COMPREHENSIVE METABOLIC PANEL
ALBUMIN: 3.9 g/dL (ref 3.5–5.2)
ALT: 10 U/L (ref 0–53)
AST: 12 U/L (ref 0–37)
Alkaline Phosphatase: 81 U/L (ref 39–117)
BUN: 14 mg/dL (ref 6–23)
CHLORIDE: 105 meq/L (ref 96–112)
CO2: 34 meq/L — AB (ref 19–32)
CREATININE: 1.22 mg/dL (ref 0.40–1.50)
Calcium: 9 mg/dL (ref 8.4–10.5)
GFR: 73.2 mL/min (ref >60.00)
GLUCOSE: 110 mg/dL — AB (ref 70–99)
POTASSIUM: 4.3 meq/L (ref 3.5–5.1)
SODIUM: 144 meq/L (ref 135–145)
Total Bilirubin: 0.4 mg/dL (ref 0.2–1.2)
Total Protein: 6.1 g/dL (ref 6.0–8.3)

## 2016-09-26 LAB — CBC
HEMATOCRIT: 38.4 % — AB (ref 39.0–52.0)
Hemoglobin: 12.2 g/dL — ABNORMAL LOW (ref 13.0–17.0)
MCHC: 31.8 g/dL (ref 30.0–36.0)
MCV: 91.8 fl (ref 78.0–100.0)
Platelets: 289 10*3/uL (ref 150.0–400.0)
RBC: 4.19 Mil/uL — AB (ref 4.22–5.81)
RDW: 13.7 % (ref 11.5–15.5)
WBC: 5.7 10*3/uL (ref 4.0–10.5)

## 2016-09-26 NOTE — Assessment & Plan Note (Signed)
S:  compliant with lasix 40mg  BID. Still with 2+ edema. Weight down 9 lbs from last visit- states trying to eat better- cut out white bread, slowed down on sandwiches A/P: monitor weight. If continues to go lower would be concerned with only minor changes. If goes back up to 180s next visit- would suspect measurement error today

## 2016-09-26 NOTE — Addendum Note (Signed)
Addended by: Netta Neat D on: 09/26/2016 02:50 PM   Modules accepted: Orders

## 2016-09-26 NOTE — Assessment & Plan Note (Signed)
S: compliant with flomax. Has BPH- does not want rectal, biopsy or psa testing.  3-4x a night nocturia down from 15 before flomax. About eevery 2 hours in the day A/P:continue current rx

## 2016-09-26 NOTE — Patient Instructions (Addendum)
Please stop by lab before you go  No changes today  ______________________________________________________________________  Starting October 1st 2018, I will be transferring to our new location:  I would love to have you remain my patient at this new location as long as it remains convenient for you. I am excited about the opportunity to have x-ray and sports medicine in the new building but will really miss the awesome staff and physicians at [facility]. Continue to schedule appointments at [facility] and we will automatically transfer them to the [place] location starting October 1st.

## 2016-09-26 NOTE — Assessment & Plan Note (Signed)
S: controlled on  benicar 40mg , amlodipine 10mg , lasix 40mg  BID, coreg 25mg  BID. Home #s 130s over 92s or 70s. Rare into 150s.  BP Readings from Last 3 Encounters:  09/26/16 138/60  03/28/16 138/62  09/29/15 (!) 174/64  A/P: We discussed blood pressure goal of <140/90. Continue current meds

## 2016-09-26 NOTE — Progress Notes (Signed)
Subjective:  John Hutchinson. is a 83 y.o. year old very pleasant male patient who presents for/with See problem oriented charting ROS- No chest pain or shortness of breath. No headache or blurry vision. Nocturia 3-4x a night.    Past Medical History-  Patient Active Problem List   Diagnosis Date Noted  . Diastolic CHF, chronic ([place]) 03/09/2014    Priority: High  . BPH (benign prostatic hyperplasia) 03/09/2014    Priority: Medium  . Degenerative arthritis 09/28/2012    Priority: Medium  . Hyperlipidemia 08/26/2006    Priority: Medium  . Essential hypertension 08/26/2006    Priority: Medium  . CKD (chronic kidney disease), stage II 02/17/2014    Priority: Low  . PROSTATE SPECIFIC ANTIGEN, ELEVATED 06/24/2006    Priority: Low  . Goals of care, counseling/discussion 09/29/2015    Medications- reviewed and updated Current Outpatient Prescriptions  Medication Sig Dispense Refill  . amLODipine (NORVASC) 10 MG tablet TAKE ONE TABLET BY MOUTH ONCE DAILY 90 tablet 3  . atorvastatin (LIPITOR) 20 MG tablet TAKE ONE TABLET BY MOUTH ONCE DAILY 90 tablet 3  . carvedilol (COREG) 25 MG tablet TAKE ONE TABLET BY MOUTH TWICE DAILY WITH A MEAL 180 tablet 3  . furosemide (LASIX) 40 MG tablet TAKE ONE TABLET BY MOUTH TWICE DAILY. SEPARATE DOSES BY 6 HOURS. 180 tablet 3  . Multiple Vitamin (MULTIVITAMIN) capsule Take 1 capsule by mouth daily.      Marland Kitchen olmesartan (BENICAR) 40 MG tablet Take 1 tablet (40 mg total) by mouth daily. 90 tablet 1  . tamsulosin (FLOMAX) 0.4 MG CAPS capsule Take 1 capsule (0.4 mg total) by mouth daily. 90 capsule 3  . traMADol (ULTRAM) 50 MG tablet TAKE ONE TABLET BY MOUTH EVERY 6 HOURS AS NEEDED FOR PAIN (MAX OF 3 TIMES A DAY) 90 tablet 1   No current facility-administered medications for this visit.    Objective: BP 138/60   Pulse 75   Temp 98.4 F (36.9 C) (Oral)   Ht 5\' 4"  (1.626 m)   Wt 176 lb (79.8 kg)   SpO2 97%   BMI 30.21 kg/m  Gen: NAD, resting  comfortably CV: RRR no murmurs rubs or gallops Lungs: CTAB no crackles, wheeze, rhonchi Abdomen: soft/nontender/nondistended/normal bowel sounds. No rebound or guarding.  Ext: 2+ edema Skin: warm, dry Neuro: walks with cane  Assessment/Plan:  Diastolic CHF, chronic S:  compliant with lasix 40mg  BID. Still with 2+ edema. Weight down 9 lbs from last visit- states trying to eat better- cut out white bread, slowed down on sandwiches A/P: monitor weight. If continues to go lower would be concerned with only minor changes. If goes back up to 180s next visit- would suspect measurement error today  Hyperlipidemia S: reasonably controlled on atorvastatin 20mg  (primary prevention so ok with softer goal 130 or less at his age). No myalgias. LDL 104 in march 2018  A/P:  continue current rx  BPH (benign prostatic hyperplasia) S: compliant with flomax. Has BPH- does not want rectal, biopsy or psa testing.  3-4x a night nocturia down from 15 before flomax. About eevery 2 hours in the day A/P:continue current rx  Essential hypertension S: controlled on  benicar 40mg , amlodipine 10mg , lasix 40mg  BID, coreg 25mg  BID. Home #s 130s over 73s or 70s. Rare into 150s.  BP Readings from Last 3 Encounters:  09/26/16 138/60  03/28/16 138/62  09/29/15 (!) 174/64  A/P: We discussed blood pressure goal of <140/90. Continue current meds  Return in about 6 months (around 03/26/2017) for follow up- or sooner if needed.  Orders Placed This Encounter  Procedures  . CBC    Standing Status:   Future    Standing Expiration Date:   09/26/2017  . Comprehensive metabolic panel    [place]    Standing Status:   Future    Standing Expiration Date:   09/26/2017   Return precautions advised.  [name], MD

## 2016-09-26 NOTE — Assessment & Plan Note (Signed)
S: reasonably controlled on atorvastatin 20mg  (primary prevention so ok with softer goal 130 or less at his age). No myalgias. LDL 104 in march 2018  A/P:  continue current rx

## 2016-09-30 ENCOUNTER — Other Ambulatory Visit: Payer: Self-pay | Admitting: Family Medicine

## 2016-10-01 ENCOUNTER — Other Ambulatory Visit: Payer: Self-pay

## 2016-10-01 DIAGNOSIS — D649 Anemia, unspecified: Secondary | ICD-10-CM

## 2016-10-09 ENCOUNTER — Other Ambulatory Visit: Payer: Self-pay | Admitting: Family Medicine

## 2016-10-11 ENCOUNTER — Other Ambulatory Visit (INDEPENDENT_AMBULATORY_CARE_PROVIDER_SITE_OTHER): Payer: Medicare Other

## 2016-10-11 DIAGNOSIS — D649 Anemia, unspecified: Secondary | ICD-10-CM

## 2016-10-11 LAB — CBC
HEMATOCRIT: 35.7 % — AB (ref 39.0–52.0)
Hemoglobin: 11.5 g/dL — ABNORMAL LOW (ref 13.0–17.0)
MCHC: 32.4 g/dL (ref 30.0–36.0)
MCV: 91.3 fl (ref 78.0–100.0)
PLATELETS: 279 10*3/uL (ref 150.0–400.0)
RBC: 3.91 Mil/uL — ABNORMAL LOW (ref 4.22–5.81)
RDW: 13.8 % (ref 11.5–15.5)
WBC: 5.4 10*3/uL (ref 4.0–10.5)

## 2016-10-14 ENCOUNTER — Other Ambulatory Visit: Payer: Self-pay

## 2016-10-14 DIAGNOSIS — D649 Anemia, unspecified: Secondary | ICD-10-CM

## 2016-10-14 NOTE — Progress Notes (Signed)
Spoke with patient who verbalized understanding of lab results. He states that it causes him a lot of pain to get out and go to the doctor's office. He states he has to have his son provide transportation. He does not want to do multiple trips. He states to make an appointment with the gastroenterologist. I will place the referral

## 2016-10-21 ENCOUNTER — Other Ambulatory Visit: Payer: Self-pay | Admitting: Family Medicine

## 2016-10-30 ENCOUNTER — Telehealth: Payer: Self-pay | Admitting: Family Medicine
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
## 2016-10-31 ENCOUNTER — Other Ambulatory Visit: Payer: Self-pay

## 2016-10-31 MED ORDER — TRAMADOL HCL 50 MG PO TABS: ORAL_TABLET | ORAL | 1 refills | Status: DC

## 2016-10-31 NOTE — Telephone Encounter (Signed)
Prescription faxed to pharmacy.

## 2016-11-18 ENCOUNTER — Encounter: Payer: Self-pay | Admitting: Nurse Practitioner

## 2016-11-28 ENCOUNTER — Ambulatory Visit (INDEPENDENT_AMBULATORY_CARE_PROVIDER_SITE_OTHER): Payer: Medicare Other | Admitting: Nurse Practitioner

## 2016-11-28 ENCOUNTER — Encounter: Payer: Self-pay | Admitting: Nurse Practitioner

## 2016-11-28 ENCOUNTER — Encounter (INDEPENDENT_AMBULATORY_CARE_PROVIDER_SITE_OTHER): Payer: Self-pay

## 2016-11-28 ENCOUNTER — Other Ambulatory Visit (INDEPENDENT_AMBULATORY_CARE_PROVIDER_SITE_OTHER): Payer: Medicare Other

## 2016-11-28 VITALS — BP 124/62 | HR 66 | Ht 66.0 in | Wt 171.4 lb

## 2016-11-28 DIAGNOSIS — D649 Anemia, unspecified: Secondary | ICD-10-CM | POA: Diagnosis not present

## 2016-11-28 LAB — IBC PANEL
IRON: 38 ug/dL — AB (ref 42–165)
SATURATION RATIOS: 12.5 % — AB (ref 20.0–50.0)
Transferrin: 217 mg/dL (ref 212.0–360.0)

## 2016-11-28 LAB — CBC
HEMATOCRIT: 38.4 % — AB (ref 39.0–52.0)
HEMOGLOBIN: 12.2 g/dL — AB (ref 13.0–17.0)
MCHC: 31.9 g/dL (ref 30.0–36.0)
MCV: 91.8 fl (ref 78.0–100.0)
Platelets: 274 10*3/uL (ref 150.0–400.0)
RBC: 4.18 Mil/uL — AB (ref 4.22–5.81)
RDW: 14.2 % (ref 11.5–15.5)
WBC: 5.4 10*3/uL (ref 4.0–10.5)

## 2016-11-28 LAB — FOLATE: Folate: >23.9 ng/mL (ref >5.9)

## 2016-11-28 LAB — FERRITIN: FERRITIN: 81.5 ng/mL (ref 22.0–322.0)

## 2016-11-28 LAB — VITAMIN B12: VITAMIN B 12: 796 pg/mL (ref 211–911)

## 2016-11-28 NOTE — Progress Notes (Signed)
HPI:  83 year old male referred by PCP Dr. [name] for anemia. In Sept 2017 his hgb was 13.2. Mid Sept 2018 it was 12.2. Checked again in late Sept it was down to 11.5. Patient denies blood in stool, bowel changes, weight loss, nausea, or any other GI problems. He doesn't take NSAIDs.  Never had a colonoscopy. No GI complaints. No blood in stool, no bowel changes, weight loss or abdominal pain. No NSAIDS.Marland Kitchen No [place] of colon cancer.   Patient has a hx of diastolic heart failure. LVEF 55-60% on echo in Feb 2016.   Past Medical History:  Diagnosis Date  . Arthritis   . Gallstones   . HYPERLIPIDEMIA 08/26/2006  . HYPERTENSION 08/26/2006  . PROSTATE SPECIFIC ANTIGEN, ELEVATED 06/24/2006     Past Surgical History:  Procedure Laterality Date  . HERNIA REPAIR    . PROSTATE BIOPSY    . TONSILLECTOMY     Family History  Problem Relation Age of Onset  . Stroke Mother        30s, smoker  . COPD Father    Social History   Tobacco Use  . Smoking status: Former Smoker    Packs/day: 1.00    Years: 3.00    Pack years: 3.00    Types: Cigarettes    Last attempt to quit: 01/15/1955    Years since quitting: 61.9  . Smokeless tobacco: Never Used  Substance Use Topics  . Alcohol use: No    Alcohol/week: 0.0 oz  . Drug use: No   Current Outpatient Medications  Medication Sig Dispense Refill  . amLODipine (NORVASC) 10 MG tablet TAKE ONE TABLET BY MOUTH ONCE DAILY 90 tablet 3  . atorvastatin (LIPITOR) 20 MG tablet TAKE ONE TABLET BY MOUTH ONCE DAILY 90 tablet 3  . carvedilol (COREG) 25 MG tablet TAKE ONE TABLET BY MOUTH TWICE DAILY WITH A MEAL 180 tablet 3  . furosemide (LASIX) 40 MG tablet TAKE ONE TABLET BY MOUTH TWICE DAILY. SEPARATE DOSES BY 6 HOURS. 180 tablet 3  . Multiple Vitamin (MULTIVITAMIN) capsule Take 1 capsule by mouth daily.      Marland Kitchen olmesartan (BENICAR) 40 MG tablet Take 1 tablet (40 mg total) by mouth daily. 90 tablet 1  . tamsulosin (FLOMAX) 0.4 MG CAPS capsule  TAKE ONE CAPSULE BY MOUTH ONCE DAILY 90 capsule 3  . traMADol (ULTRAM) 50 MG tablet TAKE ONE TABLET BY MOUTH EVERY 6 HOURS AS NEEDED FOR PAIN (MAX OF 3 TIMES A DAY) 90 tablet 1   No current facility-administered medications for this visit.    Allergies  Allergen Reactions  . Penicillins Other (See Comments)    unknown     Review of Systems: Positive for arthritis and swelling of his feet and legs.  All other systems reviewed and negative except where noted in HPI.    Physical Exam: BP 124/62   Pulse 66   Ht 5\' 6"  (1.676 m)   Wt 171 lb 6 oz (77.7 kg)   BMI 27.66 kg/m  Constitutional:  Well-developed, black male in no acute distress. Psychiatric: Normal mood and affect. Behavior is normal. EENT: Pupils normal.  Conjunctivae are normal. No scleral icterus. Neck supple.  Cardiovascular: Normal rate, regular rhythm. No edema Pulmonary/chest: Effort normal and breath sounds normal. No wheezing, rales or rhonchi. Abdominal: Soft, nondistended. Nontender. Bowel sounds active throughout. There are no masses palpable. No hepatomegaly. Rectal exam : heme negative stool Lymphadenopathy: No cervical adenopathy noted. Neurological: Alert and oriented  to person place and time. Skin: Skin is warm and dry. No rashes noted.   ASSESSMENT AND PLAN:  [birth year]. 83 yo male referred for anemia. Over the last year he has had a 2 gram drop in hgb based on most recent labs from late Sept. No overt GI blood loss, heme negative. We discussed workup for anemia including endoscopic evaluation. Given age and absence of sx patient declines EGD / colonoscopy at this point. He understands that the anemia could be from a GI lesion / malignancy. He agrees to a repeat CBC and iron studies today. If iron deficient, if hgb continues to decline, or if patient develops overt GI bleeding then he may need to reconsider endoscopic workup.    Tye Savoy, NP  11/28/2016, 2:33 PM  Cc: Marin Olp, MD

## 2016-11-28 NOTE — Patient Instructions (Signed)
If you are age 81 or older, your body mass index should be between 23-30. Your Body mass index is 27.66 kg/m. If this is out of the aforementioned range listed, please consider follow up with your Primary Care Provider.  If you are age 20 or younger, your body mass index should be between 19-25. Your Body mass index is 27.66 kg/m. If this is out of the aformentioned range listed, please consider follow up with your Primary Care Provider.   Your physician has requested that you go to the basement for lab work before leaving today.  Will call you with results.  Thank you for choosing me and [facility].   Tye Savoy, NP

## 2016-11-30 ENCOUNTER — Encounter: Payer: Self-pay | Admitting: Nurse Practitioner

## 2016-12-02 NOTE — Progress Notes (Signed)
Reviewed and agree with documentation and assessment and plan. K. Veena Emile Kyllo , MD   

## 2017-01-01 ENCOUNTER — Other Ambulatory Visit: Payer: Self-pay | Admitting: Family Medicine

## 2017-01-01 ENCOUNTER — Other Ambulatory Visit: Payer: Self-pay | Admitting: *Deleted

## 2017-01-01 MED ORDER — OLMESARTAN MEDOXOMIL 40 MG PO TABS: 40.0000 mg | ORAL_TABLET | Freq: Every day | ORAL | 0 refills | Status: DC

## 2017-01-01 NOTE — Telephone Encounter (Signed)
Copied from [place] [id number]. Topic: Quick Communication - Rx Refill/Question >> Jan 01, 2017  4:07 PM [place], Laban Emperor wrote: Has the patient contacted their pharmacy? Yes.     (Agent: If no, request that the patient contact the pharmacy for the refill.)   Preferred Pharmacy (with phone number or street name): [facility] ([facility]), [place] - [place] [phone] (Phone) [phone] (Fax)  Pt request a med refill on his olmesartan (BENICAR) 40 MG tablet [[id number]]    Agent: Please be advised that RX refills may take up to 3 business days. We ask that you follow-up with your pharmacy.

## 2017-01-24 ENCOUNTER — Other Ambulatory Visit: Payer: Self-pay

## 2017-01-24 MED ORDER — TRAMADOL HCL 50 MG PO TABS: ORAL_TABLET | ORAL | 1 refills | Status: DC

## 2017-03-27 ENCOUNTER — Ambulatory Visit: Payer: Medicare Other | Admitting: Family Medicine

## 2017-03-31 ENCOUNTER — Other Ambulatory Visit: Payer: Self-pay | Admitting: Family Medicine

## 2017-04-16 ENCOUNTER — Other Ambulatory Visit: Payer: Self-pay | Admitting: Family Medicine

## 2017-04-23 ENCOUNTER — Other Ambulatory Visit: Payer: Self-pay | Admitting: Family Medicine

## 2017-04-24 ENCOUNTER — Ambulatory Visit: Payer: Medicare Other | Admitting: Family Medicine

## 2017-04-29 ENCOUNTER — Telehealth: Payer: Self-pay | Admitting: Family Medicine

## 2017-04-29 NOTE — Telephone Encounter (Signed)
See note

## 2017-04-29 NOTE — Telephone Encounter (Signed)
Patient states he is having trouble getting his medication- it is on back order- can he get new Rx for BP?

## 2017-04-29 NOTE — Telephone Encounter (Signed)
Chat with his pharmacy and ask them what ARBs they have not on backorder or recalled. telmisartan?

## 2017-04-29 NOTE — Telephone Encounter (Signed)
Copied from [place] [id number]. Topic: Quick Communication - Rx Refill/Question >> Apr 29, 2017 12:01 PM Arletha Grippe wrote: Medication: olmesartan (BENICAR) 40 MG tablet - this medicine is on back order please switch for something else  Has the patient contacted their pharmacy? Yes.   (Agent: If no, request that the patient contact the pharmacy for the refill.) Preferred Pharmacy (with phone number or street name): [facility] [address]  Agent: Please be advised that RX refills may take up to 3 business days. We ask that you follow-up with your pharmacy. Pharm has tried sending faxes no answer  Pt is out of medication

## 2017-05-01 ENCOUNTER — Other Ambulatory Visit: Payer: Self-pay

## 2017-05-01 MED ORDER — LOSARTAN POTASSIUM 100 MG PO TABS: 100.0000 mg | ORAL_TABLET | Freq: Every day | ORAL | 3 refills | Status: DC

## 2017-05-01 NOTE — Telephone Encounter (Signed)
Medications changed as ordered. Spoke with patient who stated that he has an appointment on 05/16/17

## 2017-05-01 NOTE — Telephone Encounter (Signed)
May send losartan 100mg  #90 with 3 refills. Please take olmesartan off list. Set up BP recheck in 6-8 weeks

## 2017-05-01 NOTE — Telephone Encounter (Signed)
Pharmacy calling back, checking status. Pharmacy is  recommending Losartan. Please advise

## 2017-05-16 ENCOUNTER — Encounter: Payer: Self-pay | Admitting: Family Medicine

## 2017-05-16 ENCOUNTER — Ambulatory Visit (INDEPENDENT_AMBULATORY_CARE_PROVIDER_SITE_OTHER): Payer: Medicare Other | Admitting: Family Medicine

## 2017-05-16 VITALS — BP 130/78 | HR 70 | Temp 98.2°F | Ht 66.0 in | Wt 166.2 lb

## 2017-05-16 DIAGNOSIS — M150 Primary generalized (osteo)arthritis: Secondary | ICD-10-CM

## 2017-05-16 DIAGNOSIS — E785 Hyperlipidemia, unspecified: Secondary | ICD-10-CM | POA: Diagnosis not present

## 2017-05-16 DIAGNOSIS — M159 Polyosteoarthritis, unspecified: Secondary | ICD-10-CM

## 2017-05-16 DIAGNOSIS — D649 Anemia, unspecified: Secondary | ICD-10-CM | POA: Diagnosis not present

## 2017-05-16 DIAGNOSIS — I10 Essential (primary) hypertension: Secondary | ICD-10-CM | POA: Diagnosis not present

## 2017-05-16 DIAGNOSIS — I5032 Chronic diastolic (congestive) heart failure: Secondary | ICD-10-CM

## 2017-05-16 DIAGNOSIS — R634 Abnormal weight loss: Secondary | ICD-10-CM | POA: Insufficient documentation

## 2017-05-16 NOTE — Assessment & Plan Note (Signed)
S: Reasonably controlled on atorvastatin 20 mg.  This is for primary prevention and he is over age 84 so I am okay with LDL slightly over 100 Lab Results  Component Value Date   CHOL 174 09/29/2015   HDL 41.10 09/29/2015   LDLCALC 112 (H) 09/29/2015   LDLDIRECT 104.0 03/28/2016   TRIG 104.0 09/29/2015   CHOLHDL 4 09/29/2015   A/P: update full lipids today

## 2017-05-16 NOTE — Progress Notes (Signed)
Subjective:  John Hutchinson. is a 84 y.o. year old very pleasant male patient who presents for/with See problem oriented charting ROS- No chest pain or shortness of breath. No headache or blurry vision.  Continues to have nocturia 3-4 times a night.   Past Medical History-  Patient Active Problem List   Diagnosis Date Noted  . Anemia 05/16/2017    Priority: High  . Weight loss 05/16/2017    Priority: High  . Goals of care, counseling/discussion 09/29/2015    Priority: High  . Diastolic CHF, chronic ([place]) 03/09/2014    Priority: High  . BPH (benign prostatic hyperplasia) 03/09/2014    Priority: Medium  . Degenerative arthritis 09/28/2012    Priority: Medium  . Hyperlipidemia 08/26/2006    Priority: Medium  . Essential hypertension 08/26/2006    Priority: Medium  . CKD (chronic kidney disease), stage II 02/17/2014    Priority: Low  . PROSTATE SPECIFIC ANTIGEN, ELEVATED 06/24/2006    Priority: Low    Medications- reviewed and updated Current Outpatient Medications  Medication Sig Dispense Refill  . amLODipine (NORVASC) 10 MG tablet TAKE 1 TABLET BY MOUTH ONCE DAILY 90 tablet 3  . atorvastatin (LIPITOR) 20 MG tablet TAKE ONE TABLET BY MOUTH ONCE DAILY 90 tablet 3  . carvedilol (COREG) 25 MG tablet TAKE ONE TABLET BY MOUTH TWICE DAILY WITH A MEAL 180 tablet 3  . furosemide (LASIX) 40 MG tablet TAKE ONE TABLET BY MOUTH TWICE DAILY. SEPARATE DOSES BY 6 HOURS. 180 tablet 3  . losartan (COZAAR) 100 MG tablet Take 1 tablet (100 mg total) by mouth daily. 90 tablet 3  . Multiple Vitamin (MULTIVITAMIN) capsule Take 1 capsule by mouth daily.      . tamsulosin (FLOMAX) 0.4 MG CAPS capsule TAKE ONE CAPSULE BY MOUTH ONCE DAILY 90 capsule 3  . traMADol (ULTRAM) 50 MG tablet TAKE 1 TABLET BY MOUTH EVERY 6 HOURS AS NEEDED FOR PAIN *MAX OF 3 TIMES A DAY* 90 tablet 1   No current facility-administered medications for this visit.     Objective: BP 130/78 (BP Location: Left Arm, Patient  Position: Sitting, Cuff Size: Normal)   Pulse 70   Temp 98.2 F (36.8 C) (Oral)   Ht 5\' 6"  (1.676 m)   Wt 166 lb 3.2 oz (75.4 kg)   SpO2 98%   BMI 26.83 kg/m  Gen: NAD, resting comfortably CV: RRR no murmurs rubs or gallops Lungs: CTAB no crackles, wheeze, rhonchi Abdomen: soft/nontender/nondistended/normal bowel sounds. Thinner compared to a year or two ago Ext: 2+ edema Skin: warm, dry Neuro: walks with cane, normal speech  Assessment/Plan: Essential hypertension S: controlled on Benicar 40 mg, amlodipine 10 mg, Lasix 40 mg twice daily, Coreg 25 mg twice daily. He continues to check at home.  Still around 120-130/60s  BP Readings from Last 3 Encounters:  05/16/17 130/78  11/28/16 124/62  09/26/16 138/60  A/P:  blood pressure goal of <140/90 ideally. Continue current meds  Hyperlipidemia S: Reasonably controlled on atorvastatin 20 mg.  This is for primary prevention and he is over age 13 so I am okay with LDL slightly over 100 Lab Results  Component Value Date   CHOL 174 09/29/2015   HDL 41.10 09/29/2015   LDLCALC 112 (H) 09/29/2015   LDLDIRECT 104.0 03/28/2016   TRIG 104.0 09/29/2015   CHOLHDL 4 09/29/2015   A/P: update full lipids today  Anemia S: Patient with anemia at last visit.  He had a consultation with GI.  They discussed EGD and colonoscopy- given his age and absence of symptoms and heme negative stool patient declined further work-up.  He was aware of potential for GI lesion or malignancy.  He did agree to reconsider if he had worsening anemia.  His ferritin levels were adequate A/P: We will update a CBC today and continue to trend at least every 6 months.   Diastolic CHF, chronic  S: Patient still with 2+ edema but stable.  He is compliant with Lasix 40 mg twice a day.  Last visit his weight was down 9 pounds which he attributed to eating better though had only made some minor changes.  We discussed possible measurement error  Today,he has lost a few more lbs  from last visit- he states appetite has simply been lower and he has continued to try to eat better.  A/P:  No signs of fluid overload- continue current meds.   Weight loss Patient not interested in work-up for prior elevated PSA.  He is aware this could indicate prostate cancer.  He does not want to work this up at his age. Had biopsy in 2008 which was benign. He has had stable nocturia 3-4x a night for several years. Still on flomax. With weight loss, anemia, polyuria- we discussed possibility of prostate or colon cancer- he wants to pursue workup only if he becomes symptomatic. He agrees to follow up with me if symptoms worsening. He is down over 2 years from 194 to 24- we also agreed if he continues to lose weight at follow up to consider looking into prostate cancer and colon cancer risk. I asked him to try not to lose weight but try to keep weight stable.   Degenerative arthritis Using tramadol about 1 a day on average- for his knee OA primarily and some foot pain. At his age would prefer this over nsaids due to risk to kidneys and MI risk increase   Return in about 6 months (around 11/16/2017) for follow up- or sooner if needed.  Lab/Order associations: Hyperlipidemia, unspecified hyperlipidemia type - Plan: CBC, Comprehensive metabolic panel, Lipid panel  Return precautions advised.  [name], MD

## 2017-05-16 NOTE — Assessment & Plan Note (Signed)
Patient not interested in work-up for prior elevated PSA.  He is aware this could indicate prostate cancer.  He does not want to work this up at his age. Had biopsy in 2008 which was benign. He has had stable nocturia 3-4x a night for several years. Still on flomax. With weight loss, anemia, polyuria- we discussed possibility of prostate or colon cancer- he wants to pursue workup only if he becomes symptomatic. He agrees to follow up with me if symptoms worsening. He is down over 2 years from 194 to 70- we also agreed if he continues to lose weight at follow up to consider looking into prostate cancer and colon cancer risk. I asked him to try not to lose weight but try to keep weight stable.

## 2017-05-16 NOTE — Assessment & Plan Note (Signed)
S: Patient with anemia at last visit.  He had a consultation with GI.  They discussed EGD and colonoscopy- given his age and absence of symptoms and heme negative stool patient declined further work-up.  He was aware of potential for GI lesion or malignancy.  He did agree to reconsider if he had worsening anemia.  His ferritin levels were adequate A/P: We will update a CBC today and continue to trend at least every 6 months.

## 2017-05-16 NOTE — Assessment & Plan Note (Signed)
S: Patient still with 2+ edema but stable.  He is compliant with Lasix 40 mg twice a day.  Last visit his weight was down 9 pounds which he attributed to eating better though had only made some minor changes.  We discussed possible measurement error  Today,he has lost a few more lbs from last visit- he states appetite has simply been lower and he has continued to try to eat better.  A/P:  No signs of fluid overload- continue current meds.

## 2017-05-16 NOTE — Patient Instructions (Signed)
Schedule a lab visit at the check out desk within a month or so. Return for future fasting labs meaning nothing but water after midnight please. Ok to take your medications with water.   It would also be great if you sign up for your annual wellness visit on the same day as your labs. If they do not have a date that works for you- we can also ask Cassie specifically if she can accommodate you.   I would also like for you to sign up for an annual wellness visit with one of our nurses, Cassie or Manuela Schwartz, who both specialize in the annual wellness visit. This is a free benefit under medicare that may help [place] find additional ways to help you. Some highlights are reviewing medications, lifestyle, and doing a dementia screen.

## 2017-05-16 NOTE — Assessment & Plan Note (Signed)
Using tramadol about 1 a day on average- for his knee OA primarily and some foot pain. At his age would prefer this over nsaids due to risk to kidneys and MI risk increase

## 2017-05-16 NOTE — Assessment & Plan Note (Signed)
S: controlled on Benicar 40 mg, amlodipine 10 mg, Lasix 40 mg twice daily, Coreg 25 mg twice daily. He continues to check at home.  Still around 120-130/60s  BP Readings from Last 3 Encounters:  05/16/17 130/78  11/28/16 124/62  09/26/16 138/60  A/P:  blood pressure goal of <140/90 ideally. Continue current meds

## 2017-07-28 ENCOUNTER — Other Ambulatory Visit: Payer: Self-pay | Admitting: Family Medicine

## 2017-07-30 ENCOUNTER — Other Ambulatory Visit: Payer: Self-pay | Admitting: Family Medicine

## 2017-08-15 ENCOUNTER — Ambulatory Visit (INDEPENDENT_AMBULATORY_CARE_PROVIDER_SITE_OTHER): Payer: Medicare Other

## 2017-08-15 VITALS — BP 172/58 | HR 75 | Ht 66.0 in | Wt 168.0 lb

## 2017-08-15 DIAGNOSIS — Z0000 Encounter for general adult medical examination without abnormal findings: Secondary | ICD-10-CM | POA: Diagnosis not present

## 2017-08-15 NOTE — Progress Notes (Signed)
PCP notes:Return in about 6 months (around 11/16/2017) for follow up- or sooner if needed    Health maintenance:Up to Date   Abnormal screenings: None   Patient concerns: None   Nurse concerns:Blood Pressure elevated but patient has not taken his medications this morning. He has them in his pocket to take when he goes to eat after this appointment   Next PCP appt: 11/17/17

## 2017-08-15 NOTE — Patient Instructions (Signed)
John Hutchinson , Thank you for taking time to come for your Medicare Wellness Visit. I appreciate your ongoing commitment to your health goals. Please review the following plan we discussed and let me know if I can assist you in the future.   These are the goals we discussed: Goals    . DIET - INCREASE WATER INTAKE     Drinks at least 4 8oz glasses of water now. Would like to drink more but struggles due to be on Lasix, feels like he goes to the bathroom all the time now       This is a list of the screening recommended for you and due dates:  Health Maintenance  Topic Date Due  . Flu Shot  09/29/2022*  . Tetanus Vaccine  03/02/2023  . Pneumonia vaccines  Completed  *Topic was postponed. The date shown is not the original due date.    Preventive Care for Adults  A healthy lifestyle and preventive care can promote health and wellness. Preventive health guidelines for adults include the following key practices.  . A routine yearly physical is a good way to check with your health care provider about your health and preventive screening. It is a chance to share any concerns and updates on your health and to receive a thorough exam.  . Visit your dentist for a routine exam and preventive care every 6 months. Brush your teeth twice a day and floss once a day. Good oral hygiene prevents tooth decay and gum disease.  . The frequency of eye exams is based on your age, health, family medical history, use  of contact lenses, and other factors. Follow your health care provider's recommendations for frequency of eye exams.  . Eat a healthy diet. Foods like vegetables, fruits, whole grains, low-fat dairy products, and lean protein foods contain the nutrients you need without too many calories. Decrease your intake of foods high in solid fats, added sugars, and salt. Eat the right amount of calories for you. Get information about a proper diet from your health care provider, if necessary.  . Regular  physical exercise is one of the most important things you can do for your health. Most adults should get at least 150 minutes of moderate-intensity exercise (any activity that increases your heart rate and causes you to sweat) each week. In addition, most adults need muscle-strengthening exercises on 2 or more days a week.  Silver Sneakers may be a benefit available to you. To determine eligibility, you may visit the website: www.silversneakers.com or contact program at 959-641-8424 Mon-Fri between 8AM-8PM.   . Maintain a healthy weight. The body mass index (BMI) is a screening tool to identify possible weight problems. It provides an estimate of body fat based on height and weight. Your health care provider can find your BMI and can help you achieve or maintain a healthy weight.   For adults 20 years and older: ? A BMI below 18.5 is considered underweight. ? A BMI of 18.5 to 24.9 is normal. ? A BMI of 25 to 29.9 is considered overweight. ? A BMI of 30 and above is considered obese.   . Maintain normal blood lipids and cholesterol levels by exercising and minimizing your intake of saturated fat. Eat a balanced diet with plenty of fruit and vegetables. Blood tests for lipids and cholesterol should begin at age 55 and be repeated every 5 years. If your lipid or cholesterol levels are high, you are over 50, or you are at  high risk for heart disease, you may need your cholesterol levels checked more frequently. Ongoing high lipid and cholesterol levels should be treated with medicines if diet and exercise are not working.  . If you smoke, find out from your health care provider how to quit. If you do not use tobacco, please do not start.  . If you choose to drink alcohol, please do not consume more than 2 drinks per day. One drink is considered to be 12 ounces (355 mL) of beer, 5 ounces (148 mL) of wine, or 1.5 ounces (44 mL) of liquor.  . If you are 40-24 years old, ask your health care provider if  you should take aspirin to prevent strokes.  . Use sunscreen. Apply sunscreen liberally and repeatedly throughout the day. You should seek shade when your shadow is shorter than you. Protect yourself by wearing long sleeves, pants, a wide-brimmed hat, and sunglasses year round, whenever you are outdoors.  . Once a month, do a whole body skin exam, using a mirror to look at the skin on your back. Tell your health care provider of new moles, moles that have irregular borders, moles that are larger than a pencil eraser, or moles that have changed in shape or color.

## 2017-08-15 NOTE — Progress Notes (Signed)
Subjective:   John Hutchinson. is a 84 y.o. male who presents for Medicare Annual/Subsequent preventive examination.  Review of Systems:  No ROS.  Medicare Wellness Visit. Additional risk factors are reflected in the social history. Cardiac Risk Factors include: advanced age (>78men, >35 women);male gender;sedentary lifestyle Patient currently lives in a single story home with wife John Hutchinson of 85 years. They have a fish tank and patient is able to name all the breeds of fish in the aquarium. They have 3 children that live close by and 3 that live out of state.  Patient enjoys watching comedy shows on tv, does not leave home very often. Patient enjoys reading.    Patient gets up every 2-3 hours at night (3-4 times) a night to go to the bathroom. Patient gets 7-8 hours of sleep at night. Feels rested in the morning when he gets up. Objective:    Vitals: BP (!) 172/58 (BP Location: Left Arm, Patient Position: Sitting, Cuff Size: Large) Comment: No meds this morning, They're in his pocket to take when ea  Pulse 75   Ht 5\' 6"  (1.676 m)   Wt 168 lb (76.2 kg)   SpO2 98%   BMI 27.12 kg/m   Body mass index is 27.12 kg/m.  Advanced Directives 08/15/2017 11/08/2013 11/07/2013  Does Patient Have a Medical Advance Directive? Yes Yes No  Type of Paramedic of [place];Living will [place];Living will -  Does patient want to make changes to medical advance directive? No - Patient declined - -  Copy of [place] in Chart? No - copy requested - -  Would patient like information on creating a medical advance directive? - - No - patient declined information    Tobacco Social History   Tobacco Use  Smoking Status Former Smoker  . Packs/day: 1.00  . Years: 3.00  . Pack years: 3.00  . Types: Cigarettes  . Last attempt to quit: 01/15/1955  . Years since quitting: 62.6  Smokeless Tobacco Never Used     Counseling given: Not  Answered      Past Medical History:  Diagnosis Date  . Arthritis   . Gallstones   . HYPERLIPIDEMIA 08/26/2006  . HYPERTENSION 08/26/2006  . PROSTATE SPECIFIC ANTIGEN, ELEVATED 06/24/2006   Past Surgical History:  Procedure Laterality Date  . HERNIA REPAIR    . PROSTATE BIOPSY    . TONSILLECTOMY     Family History  Problem Relation Age of Onset  . Stroke Mother        49s, smoker  . COPD Father   . Emphysema Father    Social History   Socioeconomic History  . Marital status: Married    Spouse name: John Hutchinson  . Number of children: 6  . Years of education: 80  . Highest education level: Not on file  Occupational History  . Occupation: retired  Scientific laboratory technician  . Financial resource strain: Not on file  . Food insecurity:    Worry: Not on file    Inability: Not on file  . Transportation needs:    Medical: Not on file    Non-medical: Not on file  Tobacco Use  . Smoking status: Former Smoker    Packs/day: 1.00    Years: 3.00    Pack years: 3.00    Types: Cigarettes    Last attempt to quit: 01/15/1955    Years since quitting: 62.6  . Smokeless tobacco: Never Used  Substance and  Sexual Activity  . Alcohol use: No    Alcohol/week: 0.0 oz  . Drug use: No  . Sexual activity: Yes  Lifestyle  . Physical activity:    Days per week: Not on file    Minutes per session: Not on file  . Stress: Not on file  Relationships  . Social connections:    Talks on phone: Not on file    Gets together: Not on file    Attends religious service: Not on file    Active member of club or organization: Not on file    Attends meetings of clubs or organizations: Not on file    Relationship status: Not on file  Other Topics Concern  . Not on file  Social History Narrative   Married (wife patient of Dr. Yong Channel). 6 children. 3 grandkids.       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 amLODipine (NORVASC) 10 MG tablet TAKE 1 TABLET BY MOUTH ONCE DAILY  . atorvastatin (LIPITOR) 20 MG tablet TAKE ONE TABLET BY MOUTH ONCE DAILY  . carvedilol (COREG) 25 MG tablet TAKE ONE TABLET BY MOUTH TWICE DAILY WITH A MEAL  . furosemide (LASIX) 40 MG tablet TAKE 1 TABLET BY MOUTH TWICE DAILY. SEPARATE DOSES BY 6 HOURS  . losartan (COZAAR) 100 MG tablet Take 1 tablet (100 mg total) by mouth daily.  . Multiple Vitamin (MULTIVITAMIN) capsule Take 1 capsule by mouth daily.    . tamsulosin (FLOMAX) 0.4 MG CAPS capsule TAKE ONE CAPSULE BY MOUTH ONCE DAILY  . traMADol (ULTRAM) 50 MG tablet TAKE 1 TABLET BY MOUTH EVERY 6 HOURS AS NEEDED FOR PAIN *MAX OF 3 TIMES PER DAY*   No facility-administered encounter medications on file as of 08/15/2017.     Activities of Daily Living In your present state of health, do you have any difficulty performing the following activities: 08/15/2017  Hearing? N  Vision? N  Difficulty concentrating or making decisions? N  Walking or climbing stairs? Y  Comment Uses a cane to walk  Dressing or bathing? N  Doing errands, shopping? N  Preparing Food and eating ? N  Using the Toilet? N  In the past six months, have you accidently leaked urine? N  Do you have problems with loss of bowel control? N  Managing your Medications? N  Managing your Finances? N  Housekeeping or managing your Housekeeping? N  Some recent data might be hidden    Patient Care Team: Marin Olp, MD as PCP - General (Family Medicine)   Assessment:   This is a routine wellness examination for Jemarcus.  Exercise Activities and Dietary recommendations Current Exercise Habits: The patient does not participate in regular exercise at present, Exercise limited by: orthopedic condition(s)  Breakfast: oatmeal or grits and a meat, eggs 3 times a week, normally drinks a juice and a decaffeinated coffee with cream and sugar  Lunch:cereal with skim milk, occasional fruit,  drinks water  Dinner: Pork chops, salad, and baked potato, caffeine free coke and water  Drinks about 4 cups of water a day, prefers sweet over salty, no snacks Goals    . DIET - INCREASE WATER INTAKE     Drinks at least 4 8oz glasses of water now. Would like to drink more but struggles due to be on Lasix, feels like he goes to the bathroom all the  time now       Fall Risk Fall Risk  08/15/2017 05/16/2017 09/29/2015 09/14/2015 04/07/2014  Falls in the past year? No No No No No  Comment - - - Emmi Telephone Survey: data to providers prior to load -  Risk for fall due to : - - - - -    Depression Screen PHQ 2/9 Scores 08/15/2017 09/29/2015 04/07/2014 03/01/2013  PHQ - 2 Score 0 0 0 1    Cognitive Function     6CIT Screen 08/15/2017  What Year? 0 points  What month? 0 points  What time? 0 points  Count back from 20 0 points  Months in reverse 0 points  Repeat phrase 0 points  Total Score 0    Immunization History  Administered Date(s) Administered  . Pneumococcal Conjugate-13 03/01/2013  . Pneumococcal Polysaccharide-23 01/14/2005  . Td 01/14/1998, 03/01/2013      Screening Tests Health Maintenance  Topic Date Due  . INFLUENZA VACCINE  09/29/2022 (Originally 08/14/2017)  . TETANUS/TDAP  03/02/2023  . PNA vac Low Risk Adult  Completed    Plan:    Follow Up with PCP as advised  I have personally reviewed and noted the following in the patient's chart:   . Medical and social history . Use of alcohol, tobacco or illicit drugs  . Current medications and supplements . Functional ability and status . Nutritional status . Physical activity . Advanced directives . List of other physicians . Vitals . Screenings to include cognitive, depression, and falls . Referrals and appointments  In addition, I have reviewed and discussed with patient certain preventive protocols, quality metrics, and best practice recommendations. A written personalized care plan for preventive services  as well as general preventive health recommendations were provided to patient.     [place], [place]  4/0/9811

## 2017-08-15 NOTE — Progress Notes (Addendum)
I have personally reviewed the Medicare Annual Wellness Visit and agree with the assessment and plan.  Will discuss home BP monitoring with goal 140/90 or lower.   Algis Greenhouse. Jerline Pain, MD 08/15/2017 11:41 AM

## 2017-10-06 ENCOUNTER — Other Ambulatory Visit: Payer: Self-pay | Admitting: Family Medicine

## 2017-10-13 ENCOUNTER — Other Ambulatory Visit: Payer: Self-pay | Admitting: Family Medicine

## 2017-11-17 ENCOUNTER — Ambulatory Visit: Payer: Medicare Other | Admitting: Family Medicine

## 2018-01-01 ENCOUNTER — Encounter: Payer: Self-pay | Admitting: Family Medicine

## 2018-01-01 ENCOUNTER — Ambulatory Visit (INDEPENDENT_AMBULATORY_CARE_PROVIDER_SITE_OTHER): Payer: Medicare Other | Admitting: Family Medicine

## 2018-01-01 VITALS — BP 152/66 | HR 79 | Temp 97.8°F | Ht 66.0 in | Wt 167.4 lb

## 2018-01-01 DIAGNOSIS — D649 Anemia, unspecified: Secondary | ICD-10-CM

## 2018-01-01 DIAGNOSIS — I10 Essential (primary) hypertension: Secondary | ICD-10-CM | POA: Diagnosis not present

## 2018-01-01 DIAGNOSIS — I5032 Chronic diastolic (congestive) heart failure: Secondary | ICD-10-CM

## 2018-01-01 DIAGNOSIS — M159 Polyosteoarthritis, unspecified: Secondary | ICD-10-CM

## 2018-01-01 DIAGNOSIS — E785 Hyperlipidemia, unspecified: Secondary | ICD-10-CM | POA: Diagnosis not present

## 2018-01-01 DIAGNOSIS — L659 Nonscarring hair loss, unspecified: Secondary | ICD-10-CM | POA: Diagnosis not present

## 2018-01-01 DIAGNOSIS — M150 Primary generalized (osteo)arthritis: Secondary | ICD-10-CM

## 2018-01-01 LAB — POCT URINALYSIS DIPSTICK
Bilirubin, UA: NEGATIVE
Blood, UA: NEGATIVE
Glucose, UA: NEGATIVE
Ketones, UA: NEGATIVE
Leukocytes, UA: NEGATIVE
Nitrite, UA: NEGATIVE
PH UA: 7.5 (ref 5.0–8.0)
Protein, UA: NEGATIVE
Spec Grav, UA: 1.015 (ref 1.010–1.025)
UROBILINOGEN UA: 0.2 U/dL

## 2018-01-01 MED ORDER — MOMETASONE FUROATE 0.1 % EX SOLN: Freq: Every day | CUTANEOUS | 0 refills | Status: DC

## 2018-01-01 NOTE — Progress Notes (Signed)
Subjective:  John Hutchinson. is a 84 y.o. year old very pleasant male patient who presents for/with See problem oriented charting ROS- thickened toenails. No chest pain reported. Frequent urination reported. Stable edema   Past Medical History-  Patient Active Problem List   Diagnosis Date Noted  . Anemia 05/16/2017    Priority: High  . Weight loss 05/16/2017    Priority: High  . Goals of care, counseling/discussion 09/29/2015    Priority: High  . (HFpEF) heart failure with preserved ejection fraction ([place]) 03/09/2014    Priority: High  . BPH (benign prostatic hyperplasia) 03/09/2014    Priority: Medium  . Degenerative arthritis 09/28/2012    Priority: Medium  . Hyperlipidemia 08/26/2006    Priority: Medium  . Essential hypertension 08/26/2006    Priority: Medium  . CKD (chronic kidney disease), stage II 02/17/2014    Priority: Low  . PROSTATE SPECIFIC ANTIGEN, ELEVATED 06/24/2006    Priority: Low    Medications- reviewed and updated Current Outpatient Medications  Medication Sig Dispense Refill  . amLODipine (NORVASC) 10 MG tablet TAKE 1 TABLET BY MOUTH ONCE DAILY 90 tablet 3  . atorvastatin (LIPITOR) 20 MG tablet TAKE 1 TABLET BY MOUTH ONCE DAILY 90 tablet 3  . carvedilol (COREG) 25 MG tablet TAKE 1 TABLET BY MOUTH TWICE DAILY WITH A MEAL 180 tablet 3  . furosemide (LASIX) 40 MG tablet TAKE 1 TABLET BY MOUTH TWICE DAILY. SEPARATE DOSES BY 6 HOURS 180 tablet 1  . losartan (COZAAR) 100 MG tablet Take 1 tablet (100 mg total) by mouth daily. 90 tablet 3  . Multiple Vitamin (MULTIVITAMIN) capsule Take 1 capsule by mouth daily.      . tamsulosin (FLOMAX) 0.4 MG CAPS capsule TAKE 1 CAPSULE BY MOUTH ONCE DAILY 90 capsule 3  . traMADol (ULTRAM) 50 MG tablet TAKE 1 TABLET BY MOUTH EVERY 6 HOURS AS NEEDED FOR PAIN *MAX OF 3 PER DAY* 90 tablet 1  . mometasone (ELOCON) 0.1 % lotion Apply topically daily. Requesting solution. To itchy area on scalp until you see dermatology 60 mL 0    No current facility-administered medications for this visit.     Objective: BP (!) 152/66 (BP Location: Left Arm, Patient Position: Sitting, Cuff Size: Large)   Pulse 79   Temp 97.8 F (36.6 C) (Oral)   Ht 5\' 6"  (1.676 m)   Wt 167 lb 6.4 oz (75.9 kg)   SpO2 98%   BMI 27.02 kg/m  Gen: NAD, resting comfortably CV: RRR no murmurs rubs or gallops Lungs: CTAB no crackles, wheeze, rhonchi Abdomen: soft/nontender/nondistended/normal bowel sounds. No rebound or guarding.  Ext: no edema Skin: warm, dry, thickened yellow toenails, patch of shorter hair noted on right scalp- appears to have some scarring underneath   Assessment/Plan:  Other notes: 1.  Patient with anemia last visit-had GI consultation and they discussed EGD and colonoscopy but declined due to age and him being asymptomatic.  Unfortunately he did not stop by lab last time to update CBC-we will update CBC today 2.  Patient with prior elevated PSA but he has declined further work-up.  He had a biopsy in 2008 which was benign.  We considered work-up due to his weight being down over 2 years from 1 94-1 66 but fortunately weight seems to have plateaued. 3.  Patient with intensely pruritic scalp for about 3 months.  Hair is very short in that area as he scratches constantly- no obvious patches of hair loss though.  Patch  of excoriation and scarring in right scalp-we will try a steroid solution to see if that can at least help him with the itch-ordered referral to dermatology as well for further evaluation.  I am not clear of the etiology of this but I am hoping the steroid solution will at least help with the itch scratch cycle he has not  Hyperlipidemia S: Mild poorly controlled on atorvastatin 20 mg-not being more aggressive since this is for primary prevention over age 61 Lab Results  Component Value Date   CHOL 174 09/29/2015   HDL 41.10 09/29/2015   LDLCALC 112 (H) 09/29/2015   LDLDIRECT 104.0 03/28/2016   TRIG 104.0  09/29/2015   CHOLHDL 4 09/29/2015   A/P: Patient tolerating dose-continue atorvastatin 20 mg and update lipids today-unlikely to increase dose unless LDL over 160 or as needed for secondary prevention-if needed in the future-primary prevention only at present  Essential hypertension S: Poorly controlled initially on amlodipine 10 mg, carvedilol 25 mg twice a day, Lasix 40 mg twice daily, losartan 100 mg  Home numbers still looking excellent- 130s for most part- rarely into 140s . Diastolic #s into 71G BP Readings from Last 3 Encounters:  01/01/18 (!) 152/66  08/15/17 (!) 172/58  05/16/17 130/78  A/P: At home-he is at blood pressure goal of <140/90, mild elevation of systolic blood pressure in the office. Continue current meds:  Given excellent home control.   (HFpEF) heart failure with preserved ejection fraction ([place])  S: Patient remains on Lasix twice a day.  He has stable 2+ edema   His weight is up 1 pound from last visit. A/P: Appears stable-continue current medications and update labs today as below  Degenerative arthritis S: Patient using tramadol about once a day still for his knee osteoarthritis and occasional foot pain.  We prefer to avoid NSAIDs due to anemia, kidney risk, MI risk.  He continues to walk with a cane A/P: Stable-I am okay with tramadol refills up to 6 months-he is doing well with mostly once a day use   Future Appointments  Date Time Provider [place]  07/07/2018  1:40 PM Marin Olp, MD [facility]  08/19/2018  1:00 PM [facility] HEALTH COACH [facility]   Return in about 6 months (around 07/03/2018) for follow up- or sooner if needed.  Lab/Order associations: has had 2 meals today Hair loss - Plan: mometasone (ELOCON) 0.1 % lotion, Ambulatory referral to Dermatology  Hyperlipidemia, unspecified hyperlipidemia type - Plan: Lipid panel, Comprehensive metabolic panel, CBC, POCT urinalysis dipstick  Anemia, unspecified type - Plan: POCT urinalysis  dipstick  Essential hypertension  Chronic heart failure with preserved ejection fraction (HCC)  Primary osteoarthritis involving multiple joints  Meds ordered this encounter  Medications  . mometasone (ELOCON) 0.1 % lotion    Sig: Apply topically daily. Requesting solution. To itchy area on scalp until you see dermatology    Dispense:  60 mL    Refill:  0   Return precautions advised.  [name], MD

## 2018-01-01 NOTE — Patient Instructions (Addendum)
Please stop by lab before you go   No changes in medications other than try the solution I sent in for the itchy spot in your scalp until you see dermatology.  We will call you within two weeks about your referral to dermatology for the hair loss. If you do not hear within 3 weeks, give [place] a call.   Glad blood pressure still looks good at home

## 2018-01-02 LAB — COMPREHENSIVE METABOLIC PANEL
ALT: 14 U/L (ref 0–53)
AST: 15 U/L (ref 0–37)
Albumin: 4.4 g/dL (ref 3.5–5.2)
Alkaline Phosphatase: 91 U/L (ref 39–117)
BILIRUBIN TOTAL: 0.5 mg/dL (ref 0.2–1.2)
BUN: 12 mg/dL (ref 6–23)
CO2: 32 mEq/L (ref 19–32)
Calcium: 9.1 mg/dL (ref 8.4–10.5)
Chloride: 104 mEq/L (ref 96–112)
Creatinine, Ser: 1.12 mg/dL (ref 0.40–1.50)
GFR: 80.54 mL/min (ref >60.00)
Glucose, Bld: 96 mg/dL (ref 70–99)
Potassium: 3.6 mEq/L (ref 3.5–5.1)
Sodium: 144 mEq/L (ref 135–145)
Total Protein: 7.1 g/dL (ref 6.0–8.3)

## 2018-01-02 LAB — LIPID PANEL
CHOL/HDL RATIO: 3
Cholesterol: 152 mg/dL (ref 0–200)
HDL: 46.4 mg/dL (ref >39.00)
LDL Cholesterol: 83 mg/dL (ref 0–99)
NONHDL: 105.15
Triglycerides: 111 mg/dL (ref 0.0–149.0)
VLDL: 22.2 mg/dL (ref 0.0–40.0)

## 2018-01-02 LAB — CBC
HCT: 41.1 % (ref 39.0–52.0)
Hemoglobin: 13.6 g/dL (ref 13.0–17.0)
MCHC: 33.2 g/dL (ref 30.0–36.0)
MCV: 91.4 fl (ref 78.0–100.0)
PLATELETS: 245 10*3/uL (ref 150.0–400.0)
RBC: 4.49 Mil/uL (ref 4.22–5.81)
RDW: 13.6 % (ref 11.5–15.5)
WBC: 4.8 10*3/uL (ref 4.0–10.5)

## 2018-01-02 NOTE — Assessment & Plan Note (Signed)
S: Mild poorly controlled on atorvastatin 20 mg-not being more aggressive since this is for primary prevention over age 82 Lab Results  Component Value Date   CHOL 174 09/29/2015   HDL 41.10 09/29/2015   LDLCALC 112 (H) 09/29/2015   LDLDIRECT 104.0 03/28/2016   TRIG 104.0 09/29/2015   CHOLHDL 4 09/29/2015   A/P: Patient tolerating dose-continue atorvastatin 20 mg and update lipids today-unlikely to increase dose unless LDL over 160 or as needed for secondary prevention-if needed in the future-primary prevention only at present

## 2018-01-02 NOTE — Assessment & Plan Note (Signed)
S: Poorly controlled initially on amlodipine 10 mg, carvedilol 25 mg twice a day, Lasix 40 mg twice daily, losartan 100 mg  Home numbers still looking excellent- 130s for most part- rarely into 140s . Diastolic #s into 28M BP Readings from Last 3 Encounters:  01/01/18 (!) 152/66  08/15/17 (!) 172/58  05/16/17 130/78  A/P: At home-he is at blood pressure goal of <140/90, mild elevation of systolic blood pressure in the office. Continue current meds:  Given excellent home control.

## 2018-01-02 NOTE — Assessment & Plan Note (Signed)
S: Patient using tramadol about once a day still for his knee osteoarthritis and occasional foot pain.  We prefer to avoid NSAIDs due to anemia, kidney risk, MI risk.  He continues to walk with a cane A/P: Stable-I am okay with tramadol refills up to 6 months-he is doing well with mostly once a day use

## 2018-01-02 NOTE — Assessment & Plan Note (Signed)
S: Patient remains on Lasix twice a day.  He has stable 2+ edema   His weight is up 1 pound from last visit. A/P: Appears stable-continue current medications and update labs today as below

## 2018-01-05 ENCOUNTER — Other Ambulatory Visit: Payer: Self-pay | Admitting: Family Medicine

## 2018-01-26 ENCOUNTER — Other Ambulatory Visit: Payer: Self-pay | Admitting: Family Medicine

## 2018-02-23 DIAGNOSIS — L739 Follicular disorder, unspecified: Secondary | ICD-10-CM | POA: Diagnosis not present

## 2018-02-23 DIAGNOSIS — L659 Nonscarring hair loss, unspecified: Secondary | ICD-10-CM | POA: Diagnosis not present

## 2018-03-22 ENCOUNTER — Other Ambulatory Visit: Payer: Self-pay | Admitting: Family Medicine

## 2018-04-20 ENCOUNTER — Other Ambulatory Visit: Payer: Self-pay | Admitting: Family Medicine

## 2018-06-22 ENCOUNTER — Other Ambulatory Visit: Payer: Self-pay | Admitting: Family Medicine

## 2018-07-07 ENCOUNTER — Telehealth: Payer: Self-pay | Admitting: Family Medicine

## 2018-07-07 ENCOUNTER — Ambulatory Visit (INDEPENDENT_AMBULATORY_CARE_PROVIDER_SITE_OTHER): Payer: Medicare Other | Admitting: Family Medicine

## 2018-07-07 ENCOUNTER — Encounter: Payer: Self-pay | Admitting: Family Medicine

## 2018-07-07 VITALS — BP 137/56 | HR 66 | Ht 66.0 in | Wt 167.0 lb

## 2018-07-07 DIAGNOSIS — R350 Frequency of micturition: Secondary | ICD-10-CM | POA: Diagnosis not present

## 2018-07-07 DIAGNOSIS — M150 Primary generalized (osteo)arthritis: Secondary | ICD-10-CM | POA: Diagnosis not present

## 2018-07-07 DIAGNOSIS — E785 Hyperlipidemia, unspecified: Secondary | ICD-10-CM | POA: Diagnosis not present

## 2018-07-07 DIAGNOSIS — M159 Polyosteoarthritis, unspecified: Secondary | ICD-10-CM

## 2018-07-07 DIAGNOSIS — I5032 Chronic diastolic (congestive) heart failure: Secondary | ICD-10-CM

## 2018-07-07 DIAGNOSIS — I10 Essential (primary) hypertension: Secondary | ICD-10-CM

## 2018-07-07 DIAGNOSIS — N401 Enlarged prostate with lower urinary tract symptoms: Secondary | ICD-10-CM | POA: Diagnosis not present

## 2018-07-07 MED ORDER — TRAMADOL HCL 50 MG PO TABS: ORAL_TABLET | ORAL | 4 refills | Status: DC

## 2018-07-07 NOTE — Progress Notes (Addendum)
Phone [phone]   Subjective:  Virtual visit via phonenote Chief Complaint  Patient presents with  . Hypertension   This visit type was conducted due to national recommendations for restrictions regarding the COVID-19 Pandemic (e.g. social distancing).  This format is felt to be most appropriate for this patient at this time balancing risks to patient and risks to population by having him in for in person visit.  All issues noted in this document were discussed and addressed.  No physical exam was performed (except for noted visual exam or audio findings with Telehealth visits).  The patient has consented to conduct a Telehealth visit and understands insurance will be billed.   Our team/I connected with John Hutchinson. at  1:40 PM EDT by phone (patient did not have equipment for webex) and verified that I am speaking with the correct person using two identifiers.  Location patient: [facility] Location provider: [facility], office Persons participating in the virtual visit:  patient  Time on phone: 22 minutes Counseling provided about covid 19, needed labs  Our team/I discussed the limitations of evaluation and management by telemedicine and the availability of in person appointments. In light of current covid-19 pandemic, patient also understands that we are trying to protect them by minimizing in office contact if at all possible.  The patient expressed consent for telemedicine visit and agreed to proceed. Patient understands insurance will be billed.   ROS-continued polyuria.  No chest pain reported.  No shortness of breath reported.  No fever chills reported.  Past Medical History-  Patient Active Problem List   Diagnosis Date Noted  . Anemia 05/16/2017    Priority: High  . Weight loss 05/16/2017    Priority: High  . Goals of care, counseling/discussion 09/29/2015    Priority: High  . (HFpEF) heart failure with preserved ejection fraction ([place]) 03/09/2014    Priority: High  .  BPH (benign prostatic hyperplasia) 03/09/2014    Priority: Medium  . Degenerative arthritis 09/28/2012    Priority: Medium  . Hyperlipidemia 08/26/2006    Priority: Medium  . Essential hypertension 08/26/2006    Priority: Medium  . CKD (chronic kidney disease), stage II 02/17/2014    Priority: Low  . PROSTATE SPECIFIC ANTIGEN, ELEVATED 06/24/2006    Priority: Low    Medications- reviewed and updated Current Outpatient Medications  Medication Sig Dispense Refill  . amLODipine (NORVASC) 10 MG tablet Take 1 tablet by mouth once daily 90 tablet 0  . atorvastatin (LIPITOR) 20 MG tablet TAKE 1 TABLET BY MOUTH ONCE DAILY 90 tablet 3  . carvedilol (COREG) 25 MG tablet TAKE 1 TABLET BY MOUTH TWICE DAILY WITH A MEAL 180 tablet 3  . fluocinonide (LIDEX) 0.05 % external solution Apply 1 application topically 2 (two) times daily.    . furosemide (LASIX) 40 MG tablet TAKE 1 TABLET BY MOUTH TWICE DAILY. SEPARATE DOSES BY 6 HOURS. 180 tablet 1  . losartan (COZAAR) 100 MG tablet Take 1 tablet by mouth once daily 90 tablet 0  . minocycline (MINOCIN) 50 MG capsule TAKE 1 CAPSULE BY MOUTH TWICE DAILY WITH FOOD    . mometasone (ELOCON) 0.1 % lotion Apply topically daily. Requesting solution. To itchy area on scalp until you see dermatology 60 mL 0  . Multiple Vitamin (MULTIVITAMIN) capsule Take 1 capsule by mouth daily.      . tamsulosin (FLOMAX) 0.4 MG CAPS capsule TAKE 1 CAPSULE BY MOUTH ONCE DAILY 90 capsule 3  . traMADol (ULTRAM) 50 MG  tablet TAKE 1 TABLET BY MOUTH EVERY 6 HOURS AS NEEDED FOR PAIN.  MAX OF 3 TABLETS IN 24 HOURS 90 tablet 4   No current facility-administered medications for this visit.      Objective:  BP (!) 137/56   Pulse 66   Ht 5\' 6"  (1.676 m)   Wt 167 lb (75.8 kg)   BMI 26.95 kg/m  self reported vitals  Nonlabored voice, normal speech     Assessment and Plan   #hypertension/heart failure with preserved ejection fraction S: controlled on amlodipine and carvedilol 25 mg  BID and losartan 100mg  and lasix 40mg  twice a day  In regards to CHF-no increased edema or weight gain BP Readings from Last 3 Encounters:  07/07/18 (!) 137/56  01/01/18 (!) 152/66  08/15/17 (!) 172/58  A/P: Hypertension well controlled on above medications-we will continue  For CHF- sounds to be euvolemic-would really like to reduce amlodipine but hesitant to do so due to blood pressure issues  #hyperlipidemia S:  controlled on atorvastati 20mg  Lab Results  Component Value Date   CHOL 152 01/01/2018   HDL 46.40 01/01/2018   LDLCALC 83 01/01/2018   LDLDIRECT 104.0 03/28/2016   TRIG 111.0 01/01/2018   CHOLHDL 3 01/01/2018   A/P: Stable. Continue current medications.   # BPH S:improved on flomax- down to 3-4x a night urinating and hes thankful  A/P: improved- continue currnet medicine - has declined further workup of elevated PSA_ had biopsy in 2008    # arthritis S:left knee and hip continue to bother him. Tramadol 1-3x a day- twice a day for the most part on average.   A/P: Stable- refilled tramadol for patient  Recommended 18-month follow-up.  Recommended blood work today but patient declines due to concerns of COVID-19-fortunately last GFR and potassium was fine and he seems to be stable on last several labs. Future Appointments  Date Time Provider [place]  08/19/2018  1:00 PM [facility] HEALTH COACH [facility]  01/05/2019  1:20 PM Marin Olp, MD [facility]   Lab/Order associations:   ICD-10-CM   1. Essential hypertension  I10   2. Hyperlipidemia, unspecified hyperlipidemia type  E78.5   3. Benign prostatic hyperplasia with urinary frequency  N40.1    R35.0   4. Primary osteoarthritis involving multiple joints  M15.0   5. Chronic heart failure with preserved ejection fraction (HCC)  I50.32     Meds ordered this encounter  Medications  . traMADol (ULTRAM) 50 MG tablet    Sig: TAKE 1 TABLET BY MOUTH EVERY 6 HOURS AS NEEDED FOR PAIN.  MAX OF 3  TABLETS IN 24 HOURS    Dispense:  90 tablet    Refill:  4    Return precautions advised.  [name], MD

## 2018-07-07 NOTE — Patient Instructions (Signed)
There are no preventive care reminders to display for this patient.  Depression screen [facility] 2/9 08/15/2017 09/29/2015 04/07/2014  Decreased Interest 0 0 0  Down, Depressed, Hopeless 0 0 0  PHQ - 2 Score 0 0 0

## 2018-07-07 NOTE — Telephone Encounter (Signed)
Thanks I updated chart

## 2018-07-07 NOTE — Telephone Encounter (Signed)
Pt states he was told to call in with blood pressure readings:  07/07/2018 BP 137/56, pulse 66

## 2018-07-07 NOTE — Telephone Encounter (Signed)
See note

## 2018-07-18 ENCOUNTER — Other Ambulatory Visit: Payer: Self-pay | Admitting: Family Medicine

## 2018-07-27 ENCOUNTER — Other Ambulatory Visit: Payer: Self-pay | Admitting: Family Medicine

## 2018-08-19 ENCOUNTER — Ambulatory Visit: Payer: Medicare Other

## 2018-09-07 DIAGNOSIS — L739 Follicular disorder, unspecified: Secondary | ICD-10-CM | POA: Diagnosis not present

## 2018-09-07 DIAGNOSIS — L659 Nonscarring hair loss, unspecified: Secondary | ICD-10-CM | POA: Diagnosis not present

## 2018-09-21 ENCOUNTER — Other Ambulatory Visit: Payer: Self-pay | Admitting: Family Medicine

## 2018-09-28 ENCOUNTER — Other Ambulatory Visit: Payer: Self-pay | Admitting: Family Medicine

## 2018-10-05 ENCOUNTER — Other Ambulatory Visit: Payer: Self-pay | Admitting: Family Medicine

## 2018-10-19 ENCOUNTER — Other Ambulatory Visit: Payer: Self-pay | Admitting: Family Medicine

## 2018-10-26 ENCOUNTER — Other Ambulatory Visit: Payer: Self-pay | Admitting: Family Medicine

## 2018-10-29 ENCOUNTER — Encounter (INDEPENDENT_AMBULATORY_CARE_PROVIDER_SITE_OTHER): Payer: Self-pay

## 2018-10-29 ENCOUNTER — Other Ambulatory Visit: Payer: Self-pay

## 2018-10-29 ENCOUNTER — Ambulatory Visit (INDEPENDENT_AMBULATORY_CARE_PROVIDER_SITE_OTHER): Payer: Medicare Other

## 2018-10-29 DIAGNOSIS — Z0000 Encounter for general adult medical examination without abnormal findings: Secondary | ICD-10-CM | POA: Diagnosis not present

## 2018-10-29 NOTE — Progress Notes (Signed)
I have personally reviewed the Medicare Annual Wellness Visit and agree with the assessment and plan.  Algis Greenhouse. Jerline Pain, MD 10/29/2018 3:41 PM

## 2018-10-29 NOTE — Patient Instructions (Signed)
John Hutchinson , Thank you for taking time to come for your Medicare Wellness Visit. I appreciate your ongoing commitment to your health goals. Please review the following plan we discussed and let me know if I can assist you in the future.   Screening recommendations/referrals: Colorectal Screening: No longer indicated  Vision and Dental Exams: Recommended annual ophthalmology exams for early detection of glaucoma and other disorders of the eye Recommended annual dental exams for proper oral hygiene  Vaccinations: Influenza vaccine:  recommended this fall either at PCP office or through your local pharmacy  Pneumococcal vaccine: up to date; last 03/01/13 Tdap vaccine: up to date; last 03/01/13 Shingles vaccine: Please call your insurance company to determine your out of pocket expense for the Shingrix vaccine. You may receive this vaccine at your local pharmacy.  Advanced directives: Please bring a copy of your POA (Power of Attorney) and/or Living Will to your next appointment.  Goals: Recommend to drink at least 6-8 8oz glasses of water per day and a heart healthy diet with plenty fresh fruits and vegetables.   Next appointment: Please schedule your Annual Wellness Visit with your Nurse Health Advisor in one year.  Preventive Care 83 Years and Older, Male Preventive care refers to lifestyle choices and visits with your health care provider that can promote health and wellness. What does preventive care include?  A yearly physical exam. This is also called an annual well check.  Dental exams once or twice a year.  Routine eye exams. Ask your health care provider how often you should have your eyes checked.  Personal lifestyle choices, including:  Daily care of your teeth and gums.  Regular physical activity.  Eating a healthy diet.  Avoiding tobacco and drug use.  Limiting alcohol use.  Practicing safe sex.  Taking low doses of aspirin every day if recommended by your health  care provider..  Taking vitamin and mineral supplements as recommended by your health care provider. What happens during an annual well check? The services and screenings done by your health care provider during your annual well check will depend on your age, overall health, lifestyle risk factors, and family history of disease. Counseling  Your health care provider may ask you questions about your:  Alcohol use.  Tobacco use.  Drug use.  Emotional well-being.  Home and relationship well-being.  Sexual activity.  Eating habits.  History of falls.  Memory and ability to understand (cognition).  Work and work Statistician. Screening  You may have the following tests or measurements:  Height, weight, and BMI.  Blood pressure.  Lipid and cholesterol levels. These may be checked every 5 years, or more frequently if you are over 65 years old.  Skin check.  Lung cancer screening. You may have this screening every year starting at age 69 if you have a 30-pack-year history of smoking and currently smoke or have quit within the past 15 years.  Fecal occult blood test (FOBT) of the stool. You may have this test every year starting at age 53.  Flexible sigmoidoscopy or colonoscopy. You may have a sigmoidoscopy every 5 years or a colonoscopy every 10 years starting at age 6.  Prostate cancer screening. Recommendations will vary depending on your family history and other risks.  Hepatitis C blood test.  Hepatitis B blood test.  Sexually transmitted disease (STD) testing.  Diabetes screening. This is done by checking your blood sugar (glucose) after you have not eaten for a while (fasting). You may have  this done every 1-3 years.  Abdominal aortic aneurysm (AAA) screening. You may need this if you are a current or former smoker.  Osteoporosis. You may be screened starting at age 5 if you are at high risk. Talk with your health care provider about your test results,  treatment options, and if necessary, the need for more tests. Vaccines  Your health care provider may recommend certain vaccines, such as:  Influenza vaccine. This is recommended every year.  Tetanus, diphtheria, and acellular pertussis (Tdap, Td) vaccine. You may need a Td booster every 10 years.  Zoster vaccine. You may need this after age 80.  Pneumococcal 13-valent conjugate (PCV13) vaccine. One dose is recommended after age 57.  Pneumococcal polysaccharide (PPSV23) vaccine. One dose is recommended after age 51. Talk to your health care provider about which screenings and vaccines you need and how often you need them. This information is not intended to replace advice given to you by your health care provider. Make sure you discuss any questions you have with your health care provider. Document Released: 01/27/2015 Document Revised: 09/20/2015 Document Reviewed: 11/01/2014 Elsevier Interactive Patient Education  2017 [place] Prevention in the Home Falls can cause injuries. They can happen to people of all ages. There are many things you can do to make your home safe and to help prevent falls. What can I do on the outside of my home?  Regularly fix the edges of walkways and driveways and fix any cracks.  Remove anything that might make you trip as you walk through a door, such as a raised step or threshold.  Trim any bushes or trees on the path to your home.  Use bright outdoor lighting.  Clear any walking paths of anything that might make someone trip, such as rocks or tools.  Regularly check to see if handrails are loose or broken. Make sure that both sides of any steps have handrails.  Any raised decks and porches should have guardrails on the edges.  Have any leaves, snow, or ice cleared regularly.  Use sand or salt on walking paths during winter.  Clean up any spills in your garage right away. This includes oil or grease spills. What can I do in the  bathroom?  Use night lights.  Install grab bars by the toilet and in the tub and shower. Do not use towel bars as grab bars.  Use non-skid mats or decals in the tub or shower.  If you need to sit down in the shower, use a plastic, non-slip stool.  Keep the floor dry. Clean up any water that spills on the floor as soon as it happens.  Remove soap buildup in the tub or shower regularly.  Attach bath mats securely with double-sided non-slip rug tape.  Do not have throw rugs and other things on the floor that can make you trip. What can I do in the bedroom?  Use night lights.  Make sure that you have a light by your bed that is easy to reach.  Do not use any sheets or blankets that are too big for your bed. They should not hang down onto the floor.  Have a firm chair that has side arms. You can use this for support while you get dressed.  Do not have throw rugs and other things on the floor that can make you trip. What can I do in the kitchen?  Clean up any spills right away.  Avoid walking on wet floors.  Keep items that you use a lot in easy-to-reach places.  If you need to reach something above you, use a strong step stool that has a grab bar.  Keep electrical cords out of the way.  Do not use floor polish or wax that makes floors slippery. If you must use wax, use non-skid floor wax.  Do not have throw rugs and other things on the floor that can make you trip. What can I do with my stairs?  Do not leave any items on the stairs.  Make sure that there are handrails on both sides of the stairs and use them. Fix handrails that are broken or loose. Make sure that handrails are as long as the stairways.  Check any carpeting to make sure that it is firmly attached to the stairs. Fix any carpet that is loose or worn.  Avoid having throw rugs at the top or bottom of the stairs. If you do have throw rugs, attach them to the floor with carpet tape.  Make sure that you have a  light switch at the top of the stairs and the bottom of the stairs. If you do not have them, ask someone to add them for you. What else can I do to help prevent falls?  Wear shoes that:  Do not have high heels.  Have rubber bottoms.  Are comfortable and fit you well.  Are closed at the toe. Do not wear sandals.  If you use a stepladder:  Make sure that it is fully opened. Do not climb a closed stepladder.  Make sure that both sides of the stepladder are locked into place.  Ask someone to hold it for you, if possible.  Clearly mark and make sure that you can see:  Any grab bars or handrails.  First and last steps.  Where the edge of each step is.  Use tools that help you move around (mobility aids) if they are needed. These include:  Canes.  Walkers.  Scooters.  Crutches.  Turn on the lights when you go into a dark area. Replace any light bulbs as soon as they burn out.  Set up your furniture so you have a clear path. Avoid moving your furniture around.  If any of your floors are uneven, fix them.  If there are any pets around you, be aware of where they are.  Review your medicines with your doctor. Some medicines can make you feel dizzy. This can increase your chance of falling. Ask your doctor what other things that you can do to help prevent falls. This information is not intended to replace advice given to you by your health care provider. Make sure you discuss any questions you have with your health care provider. Document Released: 10/27/2008 Document Revised: 06/08/2015 Document Reviewed: 02/04/2014 Elsevier Interactive Patient Education  2017 [employer].

## 2018-10-29 NOTE — Progress Notes (Signed)
This visit is being conducted via phone call due to the COVID-19 pandemic. This patient has given me verbal consent via phone to conduct this visit, patient states they are participating from their home address. Some vital signs may be absent or patient reported.   Patient identification: identified by name, DOB, and current address.   Subjective:   John Hutchinson. is a 85 y.o. male who presents for Medicare Annual/Subsequent preventive examination.  Review of Systems:   Cardiac Risk Factors include: advanced age (>74men, >25 women);hypertension;male gender;dyslipidemia     Objective:    Vitals: There were no vitals taken for this visit.  There is no height or weight on file to calculate BMI.  Advanced Directives 10/29/2018 08/15/2017 11/08/2013 11/07/2013  Does Patient Have a Medical Advance Directive? Yes Yes Yes No  Type of Advance Directive Living will;Healthcare Power of John Hutchinson;Living will John Hutchinson;Living will -  Does patient want to make changes to medical advance directive? No - Patient declined No - Patient declined - -  Copy of [place] in Chart? No - copy requested No - copy requested - -  Would patient like information on creating a medical advance directive? - - - No - patient declined information    Tobacco Social History   Tobacco Use  Smoking Status Former Smoker  . Packs/day: 1.00  . Years: 3.00  . Pack years: 3.00  . Types: Cigarettes  . Quit date: 01/15/1955  . Years since quitting: 63.8  Smokeless Tobacco Never Used     Counseling given: Not Answered   Clinical Intake:  Pre-visit preparation completed: No  Pain : No/denies pain  Diabetes: No  How often do you need to have someone help you when you read instructions, pamphlets, or other written materials from your doctor or pharmacy?: 2 - Rarely  Interpreter Needed?: No  Information entered by :: John Hutchinson  Past  Medical History:  Diagnosis Date  . Arthritis   . Gallstones   . HYPERLIPIDEMIA 08/26/2006  . HYPERTENSION 08/26/2006  . PROSTATE SPECIFIC ANTIGEN, ELEVATED 06/24/2006   Past Surgical History:  Procedure Laterality Date  . HERNIA REPAIR    . PROSTATE BIOPSY    . TONSILLECTOMY     Family History  Problem Relation Age of Onset  . Stroke Mother        23s, smoker  . COPD Father   . Emphysema Father    Social History   Socioeconomic History  . Marital status: Married    Spouse name: John Hutchinson  . Number of children: 6  . Years of education: 92  . Highest education level: Not on file  Occupational History  . Occupation: retired  Scientific laboratory technician  . Financial resource strain: Not on file  . Food insecurity    Worry: Not on file    Inability: Not on file  . Transportation needs    Medical: Not on file    Non-medical: Not on file  Tobacco Use  . Smoking status: Former Smoker    Packs/day: 1.00    Years: 3.00    Pack years: 3.00    Types: Cigarettes    Quit date: 01/15/1955    Years since quitting: 63.8  . Smokeless tobacco: Never Used  Substance and Sexual Activity  . Alcohol use: No    Alcohol/week: 0.0 standard drinks  . Drug use: No  . Sexual activity: Yes  Lifestyle  . Physical activity  Days per week: Not on file    Minutes per session: Not on file  . Stress: Not on file  Relationships  . Social Herbalist on phone: Not on file    Gets together: Not on file    Attends religious service: Not on file    Active member of club or organization: Not on file    Attends meetings of clubs or organizations: Not on file    Relationship status: Not on file  Other Topics Concern  . Not on file  Social History Narrative   Married (wife patient of Dr. Yong Hutchinson). 6 children. 3 grandkids.       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 amLODipine  (NORVASC) 10 MG tablet Take 1 tablet by mouth once daily  . atorvastatin (LIPITOR) 20 MG tablet Take 1 tablet by mouth once daily  . carvedilol (COREG) 25 MG tablet TAKE 1 TABLET BY MOUTH TWICE DAILY WITH A MEAL  . fluocinonide (LIDEX) 0.05 % external solution Apply 1 application topically 2 (two) times daily.  . furosemide (LASIX) 40 MG tablet TAKE 1 TABLET BY MOUTH TWICE DAILY. *SEPARATE DOSES BY 6 HOURS*  . losartan (COZAAR) 100 MG tablet Take 1 tablet by mouth once daily  . minocycline (MINOCIN) 50 MG capsule TAKE 1 CAPSULE BY MOUTH TWICE DAILY WITH FOOD  . mometasone (ELOCON) 0.1 % lotion Apply topically daily. Requesting solution. To itchy area on scalp until you see dermatology  . Multiple Vitamin (MULTIVITAMIN) capsule Take 1 capsule by mouth daily.    . tamsulosin (FLOMAX) 0.4 MG CAPS capsule Take 1 capsule by mouth once daily  . traMADol (ULTRAM) 50 MG tablet TAKE 1 TABLET BY MOUTH EVERY 6 HOURS AS NEEDED FOR PAIN.  MAX OF 3 TABLETS IN 24 HOURS   No facility-administered encounter medications on file as of 10/29/2018.     Activities of Daily Living In your present state of health, do you have any difficulty performing the following activities: 10/29/2018  Hearing? N  Vision? N  Difficulty concentrating or making decisions? N  Walking or climbing stairs? N  Dressing or bathing? N  Doing errands, shopping? N  Preparing Food and eating ? N  Using the Toilet? N  In the past six months, have you accidently leaked urine? N  Do you have problems with loss of bowel control? N  Managing your Medications? N  Managing your Finances? N  Housekeeping or managing your Housekeeping? N  Some recent data might be hidden    Patient Care Team: John Olp, MD as PCP - General (Family Medicine) John Monarch, MD as Consulting Physician (Dermatology) Pa, [place] as Consulting Physician [facility])   Assessment:   This is a routine wellness examination for John Hutchinson.   Exercise Activities and Dietary recommendations Current Exercise Habits: The patient does not participate in regular exercise at present  Goals    . DIET - INCREASE WATER INTAKE     Drinks at least 4 8oz glasses of water now. Would like to drink more but struggles due to be on Lasix, feels like he goes to the bathroom all the time now       Fall Risk Fall Risk  10/29/2018 08/15/2017 05/16/2017 09/29/2015 09/14/2015  Falls in the past year? 0 No No No No  Comment - - - - Emmi Telephone  Survey: data to providers prior to load  Injury with Fall? 0 - - - -  Risk for fall due to : - - - - -  Follow up Falls evaluation completed;Education provided;Falls prevention discussed - - - -   Is the patient's home free of loose throw rugs in walkways, pet beds, electrical cords, etc?   yes      Grab bars in the bathroom? yes      Handrails on the stairs?   yes      Adequate lighting?   yes   Depression Screen PHQ 2/9 Scores 10/29/2018 08/15/2017 09/29/2015 04/07/2014  PHQ - 2 Score 0 0 0 0    Cognitive Function-no cognitive concerns at this time    6CIT Screen 10/29/2018 08/15/2017  What Year? 0 points 0 points  What month? 0 points 0 points  What time? 0 points 0 points  Count back from 20 0 points 0 points  Months in reverse 0 points 0 points  Repeat phrase 0 points 0 points  Total Score 0 0    Immunization History  Administered Date(s) Administered  . Pneumococcal Conjugate-13 03/01/2013  . Pneumococcal Polysaccharide-23 01/14/2005  . Td 01/14/1998, 03/01/2013    Qualifies for Shingles Vaccine? Discussed and patient will check with pharmacy for coverage.  Patient education handout provided   Screening Tests Health Maintenance  Topic Date Due  . INFLUENZA VACCINE  09/29/2022 (Originally 08/15/2018)  . TETANUS/TDAP  03/02/2023  . PNA vac Low Risk Adult  Completed   Cancer Screenings: Lung: Low Dose CT Chest recommended if Age 57-80 years, 30 pack-year currently smoking OR have quit  w/in 15years. Patient does not qualify. Colorectal: No longer indicated      Plan:  I have personally reviewed and addressed the Medicare Annual Wellness questionnaire and have noted the following in the patient's chart:  A. Medical and social history B. Use of alcohol, tobacco or illicit drugs  C. Current medications and supplements D. Functional ability and status E.  Nutritional status F.  Physical activity G. Advance directives H. List of other physicians I.  Hospitalizations, surgeries, and ER visits in previous 12 months J.  [place] such as hearing and vision if needed, cognitive and depression L. Referrals, records requested, and appointments- none   In addition, I have reviewed and discussed with patient certain preventive protocols, quality metrics, and best practice recommendations. A written personalized care plan for preventive services as well as general preventive health recommendations were provided to patient.   Signed,  John George, Hutchinson  Nurse Health Advisor   Nurse Notes: no additional

## 2018-12-21 ENCOUNTER — Other Ambulatory Visit: Payer: Self-pay | Admitting: Family Medicine

## 2018-12-28 ENCOUNTER — Other Ambulatory Visit: Payer: Self-pay | Admitting: Family Medicine

## 2019-01-04 ENCOUNTER — Other Ambulatory Visit: Payer: Self-pay

## 2019-01-04 ENCOUNTER — Other Ambulatory Visit: Payer: Self-pay | Admitting: Family Medicine

## 2019-01-05 ENCOUNTER — Encounter: Payer: Self-pay | Admitting: Family Medicine

## 2019-01-05 ENCOUNTER — Ambulatory Visit (INDEPENDENT_AMBULATORY_CARE_PROVIDER_SITE_OTHER): Payer: Medicare Other | Admitting: Family Medicine

## 2019-01-05 VITALS — BP 138/72 | HR 76 | Temp 98.4°F | Ht 64.25 in | Wt 187.8 lb

## 2019-01-05 DIAGNOSIS — E785 Hyperlipidemia, unspecified: Secondary | ICD-10-CM

## 2019-01-05 DIAGNOSIS — M159 Polyosteoarthritis, unspecified: Secondary | ICD-10-CM

## 2019-01-05 DIAGNOSIS — I10 Essential (primary) hypertension: Secondary | ICD-10-CM | POA: Diagnosis not present

## 2019-01-05 DIAGNOSIS — N401 Enlarged prostate with lower urinary tract symptoms: Secondary | ICD-10-CM

## 2019-01-05 DIAGNOSIS — I5032 Chronic diastolic (congestive) heart failure: Secondary | ICD-10-CM | POA: Diagnosis not present

## 2019-01-05 DIAGNOSIS — M8949 Other hypertrophic osteoarthropathy, multiple sites: Secondary | ICD-10-CM | POA: Diagnosis not present

## 2019-01-05 DIAGNOSIS — R350 Frequency of micturition: Secondary | ICD-10-CM

## 2019-01-05 LAB — COMPREHENSIVE METABOLIC PANEL
ALT: 15 U/L (ref 0–53)
AST: 19 U/L (ref 0–37)
Albumin: 4.3 g/dL (ref 3.5–5.2)
Alkaline Phosphatase: 96 U/L (ref 39–117)
BUN: 19 mg/dL (ref 6–23)
CO2: 32 mEq/L (ref 19–32)
Calcium: 9.4 mg/dL (ref 8.4–10.5)
Chloride: 103 mEq/L (ref 96–112)
Creatinine, Ser: 1.24 mg/dL (ref 0.40–1.50)
GFR: 67.21 mL/min (ref >60.00)
Glucose, Bld: 93 mg/dL (ref 70–99)
Potassium: 4 mEq/L (ref 3.5–5.1)
Sodium: 144 mEq/L (ref 135–145)
Total Bilirubin: 0.6 mg/dL (ref 0.2–1.2)
Total Protein: 7.1 g/dL (ref 6.0–8.3)

## 2019-01-05 LAB — CBC WITH DIFFERENTIAL/PLATELET
Basophils Absolute: 0 10*3/uL (ref 0.0–0.1)
Basophils Relative: 0.5 % (ref 0.0–3.0)
Eosinophils Absolute: 0 10*3/uL (ref 0.0–0.7)
Eosinophils Relative: 0.7 % (ref 0.0–5.0)
HCT: 37.9 % — ABNORMAL LOW (ref 39.0–52.0)
Hemoglobin: 12.1 g/dL — ABNORMAL LOW (ref 13.0–17.0)
Lymphocytes Relative: 15.3 % (ref 12.0–46.0)
Lymphs Abs: 0.8 10*3/uL (ref 0.7–4.0)
MCHC: 32 g/dL (ref 30.0–36.0)
MCV: 91.9 fl (ref 78.0–100.0)
Monocytes Absolute: 0.5 10*3/uL (ref 0.1–1.0)
Monocytes Relative: 9.5 % (ref 3.0–12.0)
Neutro Abs: 4.1 10*3/uL (ref 1.4–7.7)
Neutrophils Relative %: 74 % (ref 43.0–77.0)
Platelets: 243 10*3/uL (ref 150.0–400.0)
RBC: 4.12 Mil/uL — ABNORMAL LOW (ref 4.22–5.81)
RDW: 14.2 % (ref 11.5–15.5)
WBC: 5.5 10*3/uL (ref 4.0–10.5)

## 2019-01-05 LAB — LIPID PANEL
Cholesterol: 157 mg/dL (ref 0–200)
HDL: 44.3 mg/dL (ref >39.00)
LDL Cholesterol: 90 mg/dL (ref 0–99)
NonHDL: 113.09
Total CHOL/HDL Ratio: 4
Triglycerides: 114 mg/dL (ref 0.0–149.0)
VLDL: 22.8 mg/dL (ref 0.0–40.0)

## 2019-01-05 MED ORDER — TRAMADOL HCL 50 MG PO TABS: ORAL_TABLET | ORAL | 4 refills | Status: DC

## 2019-01-05 NOTE — Progress Notes (Signed)
Phone [phone] In person visit   Subjective:   John Hutchinson. is a 85 y.o. year old very pleasant male patient who presents for/with See problem oriented charting Chief Complaint  Patient presents with  . Follow-up    ROS- No chest pain or shortness of breath. No headache or blurry vision. Continued polyuria.    This visit occurred during the SARS-CoV-2 public health emergency.  Safety protocols were in place, including screening questions prior to the visit, additional usage of staff PPE, and extensive cleaning of exam room while observing appropriate contact time as indicated for disinfecting solutions.   Past Medical History-  Patient Active Problem List   Diagnosis Date Noted  . Anemia 05/16/2017    Priority: High  . Weight loss 05/16/2017    Priority: High  . Goals of care, counseling/discussion 09/29/2015    Priority: High  . (HFpEF) heart failure with preserved ejection fraction ([place]) 03/09/2014    Priority: High  . BPH (benign prostatic hyperplasia) 03/09/2014    Priority: Medium  . Degenerative arthritis 09/28/2012    Priority: Medium  . Hyperlipidemia 08/26/2006    Priority: Medium  . Essential hypertension 08/26/2006    Priority: Medium  . CKD (chronic kidney disease), stage II 02/17/2014    Priority: Low  . PROSTATE SPECIFIC ANTIGEN, ELEVATED 06/24/2006    Priority: Low    Medications- reviewed and updated Current Outpatient Medications  Medication Sig Dispense Refill  . amLODipine (NORVASC) 10 MG tablet Take 1 tablet by mouth once daily 90 tablet 0  . atorvastatin (LIPITOR) 20 MG tablet Take 1 tablet by mouth once daily 90 tablet 0  . carvedilol (COREG) 25 MG tablet TAKE 1 TABLET BY MOUTH TWICE DAILY WITH A MEAL 180 tablet 0  . fluocinonide (LIDEX) 0.05 % external solution Apply 1 application topically 2 (two) times daily.    . furosemide (LASIX) 40 MG tablet TAKE 1 TABLET BY MOUTH TWICE DAILY. *SEPARATE DOSES BY 6 HOURS* 180 tablet 0  . losartan  (COZAAR) 100 MG tablet Take 1 tablet by mouth once daily 90 tablet 0  . minocycline (MINOCIN) 50 MG capsule daily.     . mometasone (ELOCON) 0.1 % lotion Apply topically daily. Requesting solution. To itchy area on scalp until you see dermatology 60 mL 0  . Multiple Vitamin (MULTIVITAMIN) capsule Take 1 capsule by mouth daily.      . tamsulosin (FLOMAX) 0.4 MG CAPS capsule Take 1 capsule by mouth once daily 90 capsule 0  . traMADol (ULTRAM) 50 MG tablet TAKE 1 TABLET BY MOUTH EVERY 6 HOURS AS NEEDED FOR PAIN.  MAX OF 3 TABLETS IN 24 HOURS 90 tablet 4   No current facility-administered medications for this visit.     Objective:  BP 138/72   Pulse 76   Temp 98.4 F (36.9 C) (Temporal)   Ht 5' 4.25" (1.632 m)   Wt 187 lb 12.8 oz (85.2 kg)   SpO2 100%   BMI 31.99 kg/m  Gen: NAD, resting comfortably CV: RRR no murmurs rubs or gallops Lungs: CTAB no crackles, wheeze, rhonchi Abdomen: soft/nontender/nondistended/normal bowel sounds. No rebound or guarding.  Ext: 1-2+ edema (advised wear compression stockings) Skin: warm, dry Neuro: grossly normal, moves all extremities    Assessment and Plan   #hypertension #Heart failure with preserved ejection fraction S: compliant with amlodipine 10 mg, carvedilol 25 mg twice a day, Lasix 40 mg twice daily-separates doses about the 6 hours, losartan 100 mg  For heart failure  denies increased edema or weight gain BP Readings from Last 3 Encounters:  01/05/19 138/72  07/07/18 (!) 137/56  01/01/18 (!) 152/66  A/P: Blood pressure stable-continue current medication  For fluid status he appears stable-continue current medication   #hyperlipidemia S: compliant with atorvastatin 20 mg Lab Results  Component Value Date   CHOL 152 01/01/2018   HDL 46.40 01/01/2018   LDLCALC 83 01/01/2018   LDLDIRECT 104.0 03/28/2016   TRIG 111.0 01/01/2018   CHOLHDL 3 01/01/2018   A/P: hopefully stable- update lipid panel today   #BPH S: Still with  persistent urinary frequency despite Flomax.  Had improvement in nocturia down to 3-4 times a night with addition of this medication which has been a big help. -History of elevated PSA with biopsy in 2008 and he has declined further work-up  Lab Results  Component Value Date   PSA 8.18 (H) 06/24/2006  A/P: Continued issues with BPH but mild relief with flomax- offered urology referral again- he declines.    #Chronic pain/arthritis S: Patient continues to use tramadol for left knee and hip.  Uses tramadol twice daily for the most part- 3x sometimes when hurts at nights. Looks like #90 lasting about 6 weeks for most part on pdmp  A/P: reasonable control- needs refill today. More ideal than nsaids at his age to protect kidney function    Recommended follow up: Return in about 6 months (around 07/06/2019) for follow up- or sooner if needed.  Lab/Order associations:   ICD-10-CM   1. Essential hypertension  I10   2. Chronic heart failure with preserved ejection fraction (HCC)  I50.32   3. Hyperlipidemia, unspecified hyperlipidemia type  E78.5 CBC with Differential/Platelet    Comprehensive metabolic panel    Lipid panel  4. Primary osteoarthritis involving multiple joints  M89.49   5. Benign prostatic hyperplasia with urinary frequency  N40.1    R35.0     Meds ordered this encounter  Medications  . traMADol (ULTRAM) 50 MG tablet    Sig: TAKE 1 TABLET BY MOUTH EVERY 6 HOURS AS NEEDED FOR PAIN.  MAX OF 3 TABLETS IN 24 HOURS    Dispense:  90 tablet    Refill:  4    Return precautions advised.  [name], MD

## 2019-01-05 NOTE — Progress Notes (Deleted)
Phone: [phone]    Subjective:   Patient presents today for their annual wellness visit.    Preventive Screening-Counseling & Management  Smoking Status: ***Never Smoker Second Hand Smoking status: ***No smokers in home Alcohol intake: *** per week  Risk Factors Regular exercise: *** Diet: ***  Wt Readings from Last 3 Encounters:  07/07/18 167 lb (75.8 kg)  01/01/18 167 lb 6.4 oz (75.9 kg)  08/15/17 168 lb (76.2 kg)    Mobility assessment: *** timed get up and go <12 seconds Fall Risk: ***None  Fall Risk  10/29/2018 08/15/2017 05/16/2017 09/29/2015 09/14/2015  Falls in the past year? 0 No No No No  Comment - - - - Emmi Telephone Survey: data to providers prior to load  Injury with Fall? 0 - - - -  Risk for fall due to : - - - - -  Follow up Falls evaluation completed;Education provided;Falls prevention discussed - - - -   Opioid use history: *** no long term opioids use  Cardiac risk factors:  advanced age (older than 13 for men, 49 for women) *** *** Hyperlipidemia *** *** Hypertension *** No diabetes. *** No results found for: HGBA1C Family History: ***  Family History  Problem Relation Age of Onset  . Stroke Mother        73s, smoker  . COPD Father   . Emphysema Father     Depression Screen None. PHQ2 0 *** Depression screen [facility] 2/9 10/29/2018 08/15/2017 09/29/2015 04/07/2014 03/01/2013  Decreased Interest 0 0 0 0 0  Down, Depressed, Hopeless 0 0 0 0 1  PHQ - 2 Score 0 0 0 0 1    Activities of Daily Living Independent ADLs (toileting, bathing, dressing, transferring, eating) and IADLs (shopping, housekeeping, managing own medications, and handling finances)***  Hearing Difficulties: ***-patient declines  Cognitive Testing             No reported trouble. ***  Mini cog: *** clock draw. ***/3 delayed recall. Normal test result ***   ***"Please listen carefully. I am going to say three words that I want you to repeat back to me now and try to remember. The  words are ***banana sunrise chair *** leader, season, table  ***village kitchen baby *** Please say them for me now."  *** "Next, I want you to draw a clock for me. First, put in all of the numbers where they go."   *** "Now, set the hands to 10 past 11."  List the Names of Other Physician/Practitioners you currently use: Patient Care Team: Marin Olp, MD as PCP - General (Family Medicine) Lavonna Monarch, MD as Consulting Physician (Dermatology) Pa, [place]win as Consulting Physician (Optometry) -***  Immunization History  Administered Date(s) Administered  . Pneumococcal Conjugate-13 03/01/2013  . Pneumococcal Polysaccharide-23 01/14/2005  . Td 01/14/1998, 03/01/2013   Required Immunizations needed today *** Health Maintenance  Topic Date Due  . Flu Shot  09/29/2022*  . Tetanus Vaccine  03/02/2023  . Pneumonia vaccines  Completed  *Topic was postponed. The date shown is not the original due date.    Screening tests-  There are no preventive care reminders to display for this patient. 1. Colon cancer screening- *** 2. Lung Cancer screening- *** 3. Skin cancer screening- *** 4. Prostate cancer screening Lab Results  Component Value Date   PSA 8.18 (H) 06/24/2006    4. Cervical cancer screening- *** 5. Breast cancer screening- ***   ROS- No pertinent positives discovered in course of  AWV  The following were reviewed and entered/updated in epic: Past Medical History:  Diagnosis Date  . Arthritis   . Gallstones   . HYPERLIPIDEMIA 08/26/2006  . HYPERTENSION 08/26/2006  . PROSTATE SPECIFIC ANTIGEN, ELEVATED 06/24/2006   Patient Active Problem List   Diagnosis Date Noted  . Anemia 05/16/2017  . Weight loss 05/16/2017  . Goals of care, counseling/discussion 09/29/2015  . BPH (benign prostatic hyperplasia) 03/09/2014  . (HFpEF) heart failure with preserved ejection fraction ([place]) 03/09/2014  . CKD (chronic kidney disease), stage II 02/17/2014  .  Degenerative arthritis 09/28/2012  . Hyperlipidemia 08/26/2006  . Essential hypertension 08/26/2006  . PROSTATE SPECIFIC ANTIGEN, ELEVATED 06/24/2006   Past Surgical History:  Procedure Laterality Date  . HERNIA REPAIR    . PROSTATE BIOPSY    . TONSILLECTOMY      Family History  Problem Relation Age of Onset  . Stroke Mother        3s, smoker  . COPD Father   . Emphysema Father     Medications- reviewed and updated Current Outpatient Medications  Medication Sig Dispense Refill  . amLODipine (NORVASC) 10 MG tablet Take 1 tablet by mouth once daily 90 tablet 0  . atorvastatin (LIPITOR) 20 MG tablet Take 1 tablet by mouth once daily 90 tablet 0  . carvedilol (COREG) 25 MG tablet TAKE 1 TABLET BY MOUTH TWICE DAILY WITH A MEAL 180 tablet 0  . fluocinonide (LIDEX) 0.05 % external solution Apply 1 application topically 2 (two) times daily.    . furosemide (LASIX) 40 MG tablet TAKE 1 TABLET BY MOUTH TWICE DAILY. *SEPARATE DOSES BY 6 HOURS* 180 tablet 0  . losartan (COZAAR) 100 MG tablet Take 1 tablet by mouth once daily 90 tablet 0  . minocycline (MINOCIN) 50 MG capsule TAKE 1 CAPSULE BY MOUTH TWICE DAILY WITH FOOD    . mometasone (ELOCON) 0.1 % lotion Apply topically daily. Requesting solution. To itchy area on scalp until you see dermatology 60 mL 0  . Multiple Vitamin (MULTIVITAMIN) capsule Take 1 capsule by mouth daily.      . tamsulosin (FLOMAX) 0.4 MG CAPS capsule Take 1 capsule by mouth once daily 90 capsule 0  . traMADol (ULTRAM) 50 MG tablet TAKE 1 TABLET BY MOUTH EVERY 6 HOURS AS NEEDED FOR PAIN.  MAX OF 3 TABLETS IN 24 HOURS 90 tablet 4   No current facility-administered medications for this visit.    Allergies-reviewed and updated Allergies  Allergen Reactions  . Penicillins Other (See Comments)    unknown    Social History   Socioeconomic History  . Marital status: Married    Spouse name: [name]  . Number of children: 6  . Years of education: 11  . Highest  education level: Not on file  Occupational History  . Occupation: retired  Tobacco Use  . Smoking status: Former Smoker    Packs/day: 1.00    Years: 3.00    Pack years: 3.00    Types: Cigarettes    Quit date: 01/15/1955    Years since quitting: 64.0  . Smokeless tobacco: Never Used  Substance and Sexual Activity  . Alcohol use: No    Alcohol/week: 0.0 standard drinks  . Drug use: No  . Sexual activity: Yes  Other Topics Concern  . Not on file  Social History Narrative   Married (wife patient of Dr. Yong Channel). 6 children. 3 grandkids.       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 Difficulty of Paying Living Expenses: Not on file  Food Insecurity:   . Worried About Charity fundraiser in the Last Year: Not on file  . Ran Out of Food in the Last Year: Not on file  Transportation Needs:   . Lack of Transportation (Medical): Not on file  . Lack of Transportation (Non-Medical): Not on file  Physical Activity:   . Days of Exercise per Week: Not on file  . Minutes of Exercise per Session: Not on file  Stress:   . Feeling of Stress : Not on file  Social Connections:   . Frequency of Communication with Friends and Family: Not on file  . Frequency of Social Gatherings with Friends and Family: Not on file  . Attends Religious Services: Not on file  . Active Member of Clubs or Organizations: Not on file  . Attends Archivist Meetings: Not on file  . Marital Status: Not on file      Objective:  There were no vitals taken for this visit. Gen: NAD, resting comfortably HEENT: Mucous membranes are moist. Oropharynx normal Neck: no thyromegaly CV: RRR no murmurs rubs or gallops Lungs: CTAB no crackles, wheeze, rhonchi Abdomen: soft/nontender/nondistended/normal bowel sounds. No rebound or guarding.  Ext: no edema Skin: warm, dry Neuro: grossly normal, moves  all extremities, PERRLA***   Assessment/Plan:  AWV completed- discussed recommended screenings and documented any personalized health advice and referrals for preventive counseling. See AVS as well which was given to patient.   Status of chronic or acute concerns  ***  No problem-specific Assessment & Plan notes found for this encounter.   Recommended follow up: ***No follow-ups on file. Future Appointments  Date Time Provider [place]  01/05/2019  1:20 PM Marin Olp, MD [facility]     Lab/Order associations: No diagnosis found.  No orders of the defined types were placed in this encounter.   Return precautions advised.  Francella Solian, CMA

## 2019-01-05 NOTE — Patient Instructions (Addendum)
Please stop by lab before you go If you do not have mychart- we will call you about results within 5 business days of [place] receiving them.  If you have mychart- we will send your results within 3 business days of [place] receiving them.  If abnormal or we want to clarify a result, we will call or mychart you to make sure you receive the message.  If you have questions or concerns or don't hear within 5-7 days, please send [place] a message or call us.   No changes today   Recommended follow up: Return in about 6 months (around 07/06/2019) for follow up- or sooner if needed.

## 2019-01-06 ENCOUNTER — Other Ambulatory Visit: Payer: Self-pay

## 2019-01-06 DIAGNOSIS — D649 Anemia, unspecified: Secondary | ICD-10-CM

## 2019-01-18 ENCOUNTER — Other Ambulatory Visit: Payer: Self-pay | Admitting: Family Medicine

## 2019-02-01 ENCOUNTER — Other Ambulatory Visit: Payer: Self-pay | Admitting: Family Medicine

## 2019-03-22 ENCOUNTER — Other Ambulatory Visit: Payer: Self-pay | Admitting: Family Medicine

## 2019-03-29 ENCOUNTER — Other Ambulatory Visit: Payer: Self-pay | Admitting: Family Medicine

## 2019-04-05 ENCOUNTER — Other Ambulatory Visit: Payer: Self-pay | Admitting: Family Medicine

## 2019-04-15 ENCOUNTER — Other Ambulatory Visit: Payer: Self-pay | Admitting: Family Medicine

## 2019-05-03 ENCOUNTER — Other Ambulatory Visit: Payer: Self-pay | Admitting: Family Medicine

## 2019-06-21 ENCOUNTER — Other Ambulatory Visit: Payer: Self-pay | Admitting: Family Medicine

## 2019-06-28 ENCOUNTER — Other Ambulatory Visit: Payer: Self-pay | Admitting: Family Medicine

## 2019-07-12 ENCOUNTER — Other Ambulatory Visit: Payer: Self-pay | Admitting: Family Medicine

## 2019-07-15 ENCOUNTER — Telehealth (INDEPENDENT_AMBULATORY_CARE_PROVIDER_SITE_OTHER): Payer: Medicare Other | Admitting: Family Medicine

## 2019-07-15 ENCOUNTER — Encounter: Payer: Self-pay | Admitting: Family Medicine

## 2019-07-15 VITALS — BP 139/59 | HR 76 | Ht 64.25 in | Wt 180.0 lb

## 2019-07-15 DIAGNOSIS — M8949 Other hypertrophic osteoarthropathy, multiple sites: Secondary | ICD-10-CM

## 2019-07-15 DIAGNOSIS — I10 Essential (primary) hypertension: Secondary | ICD-10-CM

## 2019-07-15 DIAGNOSIS — M159 Polyosteoarthritis, unspecified: Secondary | ICD-10-CM

## 2019-07-15 DIAGNOSIS — E785 Hyperlipidemia, unspecified: Secondary | ICD-10-CM | POA: Diagnosis not present

## 2019-07-15 DIAGNOSIS — R972 Elevated prostate specific antigen [PSA]: Secondary | ICD-10-CM

## 2019-07-15 DIAGNOSIS — M150 Primary generalized (osteo)arthritis: Secondary | ICD-10-CM

## 2019-07-15 DIAGNOSIS — R350 Frequency of micturition: Secondary | ICD-10-CM

## 2019-07-15 DIAGNOSIS — N401 Enlarged prostate with lower urinary tract symptoms: Secondary | ICD-10-CM

## 2019-07-15 NOTE — Assessment & Plan Note (Signed)
#  BPH- patient with elevated PSA In 2008 up to 8.26- saw urology and had reassuring biopsy he reports. Remains on flomax

## 2019-07-15 NOTE — Progress Notes (Signed)
Phone [phone] Virtual visit via phonenote   Subjective:   Chief Complaint  Patient presents with  . Follow-up  . Hyperlipidemia  . Hypertension   This visit type was conducted due to national recommendations for restrictions regarding the COVID-19 Pandemic (e.g. social distancing).  This format is felt to be most appropriate for this patient at this time balancing risks to patient and risks to population by having him in for in person visit.  All issues noted in this document were discussed and addressed.  No physical exam was performed (except for noted visual exam or audio findings with Telehealth visits).  The patient has consented to conduct a Telehealth visit and understands insurance will be billed.   Our team/I connected with John Hutchinson. at  1:40 PM EDT by phone (patient did not have equipment for webex) and verified that I am speaking with the correct person using two identifiers.  Location patient: [place] Location provider: [facility], office Persons participating in the virtual visit:  patient  Time on phone: 21 minutes Counseling provided about : cancer screening. Encouraged covid 19 vaccine  Our team/I discussed the limitations of evaluation and management by telemedicine and the availability of in person appointments. In light of current covid-19 pandemic, patient also understands that we are trying to protect them by minimizing in office contact if at all possible.  The patient expressed consent for telemedicine visit and agreed to proceed. Patient understands insurance will be billed.   Past Medical History-  Patient Active Problem List   Diagnosis Date Noted  . Anemia 05/16/2017    Priority: High  . Weight loss 05/16/2017    Priority: High  . Goals of care, counseling/discussion 09/29/2015    Priority: High  . (HFpEF) heart failure with preserved ejection fraction ([place]) 03/09/2014    Priority: High  . BPH (benign prostatic hyperplasia) 03/09/2014     Priority: Medium  . Degenerative arthritis 09/28/2012    Priority: Medium  . Hyperlipidemia 08/26/2006    Priority: Medium  . Essential hypertension 08/26/2006    Priority: Medium  . CKD (chronic kidney disease), stage II 02/17/2014    Priority: Low  . PROSTATE SPECIFIC ANTIGEN, ELEVATED 06/24/2006    Priority: Low    Medications- reviewed and updated Current Outpatient Medications  Medication Sig Dispense Refill  . amLODipine (NORVASC) 10 MG tablet Take 1 tablet by mouth once daily 90 tablet 0  . atorvastatin (LIPITOR) 20 MG tablet Take 1 tablet by mouth once daily 90 tablet 0  . carvedilol (COREG) 25 MG tablet TAKE 1 TABLET BY MOUTH TWICE DAILY WITH A MEAL 180 tablet 3  . fluocinonide (LIDEX) 0.05 % external solution Apply 1 application topically 2 (two) times daily.    . furosemide (LASIX) 40 MG tablet TAKE 1 TABLET BY MOUTH TWICE DAILY *SEPARATE  DOSES BY 6 HOURS* 180 tablet 0  . losartan (COZAAR) 100 MG tablet Take 1 tablet by mouth once daily 90 tablet 3  . minocycline (MINOCIN) 50 MG capsule daily.     . mometasone (ELOCON) 0.1 % lotion Apply topically daily. Requesting solution. To itchy area on scalp until you see dermatology 60 mL 0  . Multiple Vitamin (MULTIVITAMIN) capsule Take 1 capsule by mouth daily.      . tamsulosin (FLOMAX) 0.4 MG CAPS capsule Take 1 capsule by mouth once daily 90 capsule 3  . traMADol (ULTRAM) 50 MG tablet TAKE 1 TABLET BY MOUTH EVERY 6 HOURS AS NEEDED FOR PAIN. MAX  OF 3 TABLETS IN 24 HOURS 90 tablet 0   No current facility-administered medications for this visit.     Objective:  Ht 5' 4.25" (1.632 m)   Wt 180 lb (81.6 kg)   BMI 30.66 kg/m  self reported vitals  Nonlabored voice, normal speech      Assessment and Plan   #hypertension S: medication: Furosemide 40Mg  wants to know can he take this once a day due to having to use the bathroom frequently- takes in AM and with dinner- recommended could move up to 6 hours after first dose,  carvedilol 25Mg  twice a day, Amlodipine 10Mg  BP Readings from Last 3 Encounters:  07/15/19 (!) 139/59  01/05/19 138/72  07/07/18 (!) 137/56  A/P: Stable. Continue current medications.   #hyperlipidemia S: Medication: atorvasatatin 20mg   Lab Results  Component Value Date   CHOL 157 01/05/2019   HDL 44.30 01/05/2019   LDLCALC 90 01/05/2019   LDLDIRECT 104.0 03/28/2016   TRIG 114.0 01/05/2019   CHOLHDL 4 01/05/2019   A/P: Slightly high cholesterol noted but given age over-typically would not increase cholesterol medication  #Heart failure with preserved ejection fraction S: Medication:lasix 40mg  bid.    Edema: not worsening.  Weight gain: slightly lower than last visit but on home scales so different baseline.  Shortness of breath:  Orthopnea/PND: none  A/P: CHF stable- continue lasixx 40mg  BID. Discussed some regimens to reduce lasix potentially but patient thought simpler to just continue twice daily.    Sore Anus S: pt c/o straining after using the bathroom and states his anus feels sore and swollen. Noted hemorrhoid.  A/P: sounds like hemorrhoids- recommended witch hazel for itch or hydrocortisone. Avoid preparatoin h as can raise BP   # anemia iron stores normal in 2018- declines repeat hemocult until next vsiit- not sure he would want to do this or have colonoscopy. Has had mild anemia which we will monitor at next check but has been stable for several years  #BPH- patient with elevated PSA In 2008 up to 8.45- saw urology and had reassuring biopsy he reports. Remains on flomax. Offered PSA repeat which he declined. And does not want to follow up with urology  #arthritis- bothers knees and hips.   Recommended follow up:  6 months follow up or sooner if needed  Lab/Order associations:   ICD-10-CM   1. Essential hypertension  I10   2. Hyperlipidemia, unspecified hyperlipidemia type  E78.5     No orders of the defined types were placed in this encounter.   Return  precautions advised.  [name], MD

## 2019-08-05 ENCOUNTER — Telehealth: Payer: Self-pay | Admitting: Family Medicine

## 2019-08-05 DIAGNOSIS — I5032 Chronic diastolic (congestive) heart failure: Secondary | ICD-10-CM

## 2019-08-05 MED ORDER — FUROSEMIDE 40 MG PO TABS: ORAL_TABLET | ORAL | 0 refills | Status: DC

## 2019-08-05 NOTE — Telephone Encounter (Signed)
MEDICATION: furosemide (lasix) 40 MG  PHARMACY: [place]  Comments:   **Let patient know to contact pharmacy at the end of the day to make sure medication is ready. **  ** Please notify patient to allow 48-72 hours to process**  **Encourage patient to contact the pharmacy for refills or they can request refills through [facility]**

## 2019-08-23 ENCOUNTER — Other Ambulatory Visit: Payer: Self-pay | Admitting: Family Medicine

## 2019-08-23 NOTE — Telephone Encounter (Signed)
Last refill: 07/12/19 #90, 0 Last OV: 07/15/19 dx. HTN f/u

## 2019-09-16 ENCOUNTER — Other Ambulatory Visit: Payer: Self-pay | Admitting: Family Medicine

## 2019-09-23 ENCOUNTER — Other Ambulatory Visit: Payer: Self-pay | Admitting: Family Medicine

## 2019-10-02 ENCOUNTER — Other Ambulatory Visit: Payer: Self-pay | Admitting: Family Medicine

## 2019-10-04 ENCOUNTER — Telehealth: Payer: Self-pay | Admitting: Family Medicine

## 2019-10-04 DIAGNOSIS — R3589 Other polyuria: Secondary | ICD-10-CM

## 2019-10-04 NOTE — Telephone Encounter (Signed)
Please check a urine culture under polyuria to make sure no infection  Also tell patient if he would like he can take Lasix twice a day every other day alternating with once daily-he needs to watch his weight, swelling very closely.  If any swelling or increased weight needs to go back to twice a day.

## 2019-10-04 NOTE — Telephone Encounter (Signed)
Patient called in stated that he been using the bathroom a lot more since taking furosemide (LASIX) 40 MG tablet twice a day. Patient stated he had to buy depends since it is getting so bad.

## 2019-10-04 NOTE — Telephone Encounter (Signed)
Pt called following up on this °

## 2019-10-05 NOTE — Telephone Encounter (Signed)
Reason for Call Symptomatic / Request for Health Information Initial Comment Caller States they called this morning and spoke with someone regarding an issues with their medication. States when they go to the restroom most of the time it's not solid and causing them to wipe a lot. States they believe it is Furosemide. Translation No Nurse Assessment Nurse: Radford Pax, RN, Eugene Garnet Date/Time Eilene Ghazi Time): 10/04/2019 6:07:03 PM Confirm and document reason for call. If symptomatic, describe symptoms. ---Caller states that he has had furosemide increased to twice a day. Is having softer stools since then. Still drinking fluids and urinating. Has the patient had close contact with a person known or suspected to have the novel coronavirus illness OR traveled / lives in area with major community spread (including international travel) in the last 14 days from the onset of symptoms? * If Asymptomatic, screen for exposure and travel within the last 14 days. ---No Does the patient have any new or worsening symptoms? ---Yes Will a triage be completed? ---Yes Related visit to physician within the last 2 weeks? ---No Does the PT have any chronic conditions? (i.e. diabetes, asthma, this includes High risk factors for pregnancy, etc.) ---Yes List chronic conditions. ---HTN, edema Is this a behavioral health or substance abuse call? ---No Guidelines Guideline Title Affirmed Question Affirmed Notes Nurse Date/Time (Eastern Time) Diarrhea [1] MILD diarrhea (e.g., 1-3 or more stools than normal in past 24 hours) Turner, RN, Eugene Garnet 10/04/2019 6:12:08 PMPLEASE NOTE: All timestamps contained within this report are represented as [place] Standard Time. CONFIDENTIALTY NOTICE: This fax transmission is intended only for the addressee. It contains information that is legally privileged, confidential or otherwise protected from use or disclosure. If you are not the intended recipient, you are strictly prohibited  from reviewing, disclosing, copying using or disseminating any of this information or taking any action in reliance on or regarding this information. If you have received this fax in error, please notify us immediately by telephone so that we can arrange for its return to [place]. Phone: [phone], Toll-Free: [phone], Fax: [phone] Page: 2 of 2 Call Id: [id number] Guidelines Guideline Title Affirmed Question Affirmed Notes Nurse Date/Time Eilene Ghazi Time) without known cause AND [2] present > 7 days Disp. Time Eilene Ghazi Time) Disposition Final User 10/04/2019 6:17:31 PM SEE PCP WITHIN 3 DAYS Yes Turner, RN, Eugene Garnet Caller Disagree/Comply Comply Caller Understands Yes PreDisposition Call Doctor Care Advice Given Per Guideline SEE PCP WITHIN 3 DAYS: * You need to be seen within 2 or 3 days. * PCP VISIT: Call your doctor (or NP/PA) during regular office hours and make an appointment. A clinic or urgent care center are good places to go for care if your doctor's office is closed or you can't get an appointment. NOTE: If office will be open tomorrow, tell caller to call then, not in 3 days. FLUID THERAPY DURING MILD TO MODERATE DIARRHEA: * Drink more fluids, at least 8 to 10 cups daily. One cup equals 8 oz (240 ml). CALL BACK IF: * Signs of dehydration occur (e.g., no urine over 12 hours, very dry mouth, lightheaded, etc.) * You become worse CARE ADVICE given per Diarrhea (Adult) guideline. Comments User: Susie Cassette, RN Date/Time Eilene Ghazi Time): 10/04/2019 6:19:13 PM Caller wanted to know about supplements to help bulk up stool. Advised to try metamucil or benefiber

## 2019-10-05 NOTE — Telephone Encounter (Signed)
Spoke with the patient and he verbalized understanding instructions given by Dr. Yong Channel and was able to repeat them back to me. Lab order has been placed for the patient to come in and leave a urine sample.

## 2019-11-11 ENCOUNTER — Other Ambulatory Visit: Payer: Self-pay | Admitting: Family Medicine

## 2019-11-30 ENCOUNTER — Telehealth: Payer: Self-pay | Admitting: Family Medicine

## 2019-11-30 NOTE — Telephone Encounter (Signed)
NO ANSWER/NO VM. Pt due to schedule Medicare Annual Wellness Visit (AWV) either virtually OR in office.   Last AWV 10/29/18; please schedule at anytime with [facility] Ad[facility] at [facility].  This should be a 45 minute visit.

## 2019-12-07 DIAGNOSIS — Z23 Encounter for immunization: Secondary | ICD-10-CM | POA: Diagnosis not present

## 2019-12-14 ENCOUNTER — Telehealth: Payer: Self-pay

## 2019-12-14 NOTE — Telephone Encounter (Signed)
Nurse Assessment Nurse: Verita Schneiders, RN, April Date/Time Eilene Ghazi Time): 12/13/2019 4:56:29 PM Confirm and document reason for call. If symptomatic, describe symptoms. ---Caller states a medication he has been using for his rectum is still swollen--started on one side and now on both sides. Cortisone cream. Hx of hemorrhoids. Does the patient have any new or worsening symptoms? ---Yes Will a triage be completed? ---Yes Related visit to physician within the last 2 weeks? ---No Does the PT have any chronic conditions? (i.e. diabetes, asthma, this includes High risk factors for pregnancy, etc.) ---Yes List chronic conditions. ---hx hemorrhoids Is this a behavioral health or substance abuse call? ---No Guidelines Guideline Title Affirmed Question Affirmed Notes Nurse Date/Time (Eastern Time) Rectal Symptoms [1] Home treatment > 3 days for rectal pain AND [2] not improved Beams, RN, April 12/13/2019 5:00:43 PM Disp. Time Eilene Ghazi Time) Disposition Final User 12/13/2019 5:05:07 PM SEE PCP WITHIN 3 DAYS Yes Beams, RN, April PLEASE NOTE: All timestamps contained within this report are represented as [place] Standard Time. CONFIDENTIALTY NOTICE: This fax transmission is intended only for the addressee. It contains information that is legally privileged, confidential or otherwise protected from use or disclosure. If you are not the intended recipient, you are strictly prohibited from reviewing, disclosing, copying using or disseminating any of this information or taking any action in reliance on or regarding this information. If you have received this fax in error, please notify us immediately by telephone so that we can arrange for its return to [place]. Phone: [phone], Toll-Free: [phone], Fax: [phone] Page: 2 of 2 Call Id: [id number] [place] Disagree/Comply Comply Caller Understands Yes PreDisposition Did not know what to do Care Advice Given Per Guideline SEE PCP WITHIN 3 DAYS: * You need  to be seen within 2 or 3 days. * PCP VISIT: Call your doctor (or NP/PA) during regular office hours and make an appointment. A clinic or urgent care center are good places to go for care if your doctor's office is closed or you can't get an appointment. NOTE: If office will be open tomorrow, tell caller to call then, not in 3 days. WARM SALINE SITZ BATHS - TWICE DAILY FOR RECTAL SYMPTOMS: * Sit in a warm sitz bath for 20 minutes twice a day. This will decrease swelling and irritation, keep the area clean, and help with healing. * Afterwards, pat area dry with unscented toilet paper. PREVENTING CONSTIPATION: * Eat a high fiber diet. * Drink adequate liquids. CALL BACK IF: * You become worse CARE ADVICE given per Rectal Symptoms (Adult) guideline. * Fever occurs * Severe pain Comments User: April, Beams, RN Date/Time Eilene Ghazi Time): 12/13/2019 5:01:51 PM pain (2/10) Referrals REFERRED TO PCP OFFICNurse Assessment Nurse: Verita Schneiders, RN, April Date/Time Eilene Ghazi Time): 12/13/2019 4:56:29 PM Confirm and document reason for call. If symptomatic, describe symptoms. ---Caller states a medication he has been using for his rectum is still swollen--started on one side and now on both sides. Cortisone cream. Hx of hemorrhoids. Does the patient have any new or worsening symptoms? ---Yes Will a triage be completed? ---Yes Related visit to physician within the last 2 weeks? ---No Does the PT have any chronic conditions? (i.e. diabetes, asthma, this includes High risk factors for pregnancy, etc.) ---Yes List chronic conditions. ---hx hemorrhoids Is this a behavioral health or substance abuse call? ---No Guidelines Guideline Title Affirmed Question Affirmed Notes Nurse Date/Time (Eastern Time) Rectal Symptoms [1] Home treatment > 3 days for rectal pain AND [2] not improved Beams, RN, April 12/13/2019 5:00:43 PM Disp.  Time Eilene Ghazi Time) Disposition Final User 12/13/2019 5:05:07 PM SEE PCP WITHIN 3 DAYS  Yes Beams, RN, April PLEASE NOTE: All timestamps contained within this report are represented as [place] Standard Time. CONFIDENTIALTY NOTICE: This fax transmission is intended only for the addressee. It contains information that is legally privileged, confidential or otherwise protected from use or disclosure. If you are not the intended recipient, you are strictly prohibited from reviewing, disclosing, copying using or disseminating any of this information or taking any action in reliance on or regarding this information. If you have received this fax in error, please notify us immediately by telephone so that we can arrange for its return to [place]. Phone: [phone], Toll-Free: [phone], Fax: [phone] Page: 2 of 2 Call Id: [id number] [place] Disagree/Comply Comply Caller Understands Yes PreDisposition Did not know what to do Care Advice Given Per Guideline SEE PCP WITHIN 3 DAYS: * You need to be seen within 2 or 3 days. * PCP VISIT: Call your doctor (or NP/PA) during regular office hours and make an appointment. A clinic or urgent care center are good places to go for care if your doctor's office is closed or you can't get an appointment. NOTE: If office will be open tomorrow, tell caller to call then, not in 3 days. WARM SALINE SITZ BATHS - TWICE DAILY FOR RECTAL SYMPTOMS: * Sit in a warm sitz bath for 20 minutes twice a day. This will decrease swelling and irritation, keep the area clean, and help with healing. * Afterwards, pat area dry with unscented toilet paper. PREVENTING CONSTIPATION: * Eat a high fiber diet. * Drink adequate liquids. CALL BACK IF: * You become worse CARE ADVICE given per Rectal Symptoms (Adult) guideline. * Fever occurs * Severe pain Comments User: April, Beams, RN Date/Time Eilene Ghazi Time): 12/13/2019 5:01:51 PM pain (2/10) Referrals REFERRED TO PCP OFFICE

## 2019-12-14 NOTE — Telephone Encounter (Signed)
Patient is scheduled for tomorrow with Dr.Hunter.

## 2019-12-15 ENCOUNTER — Encounter: Payer: Self-pay | Admitting: Family Medicine

## 2019-12-15 ENCOUNTER — Other Ambulatory Visit: Payer: Self-pay | Admitting: Family Medicine

## 2019-12-15 ENCOUNTER — Ambulatory Visit (HOSPITAL_COMMUNITY)
Admission: RE | Admit: 2019-12-15 | Discharge: 2019-12-15 | Disposition: A | Discharge status: Home / Self Care | Payer: Medicare Other | Source: Ambulatory Visit | Attending: Family Medicine | Admitting: Family Medicine

## 2019-12-15 ENCOUNTER — Ambulatory Visit (INDEPENDENT_AMBULATORY_CARE_PROVIDER_SITE_OTHER): Payer: Medicare Other | Admitting: Family Medicine

## 2019-12-15 ENCOUNTER — Other Ambulatory Visit: Payer: Self-pay

## 2019-12-15 VITALS — BP 115/57 | HR 82 | Temp 99.5°F | Ht 64.0 in

## 2019-12-15 DIAGNOSIS — N400 Enlarged prostate without lower urinary tract symptoms: Secondary | ICD-10-CM | POA: Diagnosis not present

## 2019-12-15 DIAGNOSIS — K5730 Diverticulosis of large intestine without perforation or abscess without bleeding: Secondary | ICD-10-CM | POA: Diagnosis not present

## 2019-12-15 DIAGNOSIS — K8020 Calculus of gallbladder without cholecystitis without obstruction: Secondary | ICD-10-CM | POA: Diagnosis not present

## 2019-12-15 DIAGNOSIS — R634 Abnormal weight loss: Secondary | ICD-10-CM | POA: Diagnosis not present

## 2019-12-15 DIAGNOSIS — R972 Elevated prostate specific antigen [PSA]: Secondary | ICD-10-CM

## 2019-12-15 DIAGNOSIS — K6289 Other specified diseases of anus and rectum: Secondary | ICD-10-CM

## 2019-12-15 DIAGNOSIS — K4090 Unilateral inguinal hernia, without obstruction or gangrene, not specified as recurrent: Secondary | ICD-10-CM | POA: Diagnosis not present

## 2019-12-15 DIAGNOSIS — I5032 Chronic diastolic (congestive) heart failure: Secondary | ICD-10-CM

## 2019-12-15 LAB — POCT I-STAT CREATININE: Creatinine, Ser: 2.1 mg/dL — ABNORMAL HIGH (ref 0.61–1.24)

## 2019-12-15 MED ORDER — TAMSULOSIN HCL 0.4 MG PO CAPS
0.4000 mg | ORAL_CAPSULE | Freq: Every day | ORAL | 3 refills | Status: DC
Start: 2019-12-15 — End: 2020-01-14

## 2019-12-15 MED ORDER — LOSARTAN POTASSIUM 100 MG PO TABS
100.0000 mg | ORAL_TABLET | Freq: Every day | ORAL | 3 refills | Status: DC
Start: 2019-12-15 — End: 2020-01-14

## 2019-12-15 MED ORDER — ATORVASTATIN CALCIUM 20 MG PO TABS
20.0000 mg | ORAL_TABLET | Freq: Every day | ORAL | 3 refills | Status: DC
Start: 2019-12-15 — End: 2020-01-14

## 2019-12-15 MED ORDER — CARVEDILOL 25 MG PO TABS
ORAL_TABLET | ORAL | 3 refills | Status: DC
Start: 2019-12-15 — End: 2020-01-14

## 2019-12-15 MED ORDER — TRAMADOL HCL 50 MG PO TABS: 50.0000 mg | ORAL_TABLET | ORAL | 0 refills | Status: DC

## 2019-12-15 MED ORDER — FUROSEMIDE 40 MG PO TABS: 40.0000 mg | ORAL_TABLET | Freq: Every day | ORAL | 3 refills | Status: DC

## 2019-12-15 MED ORDER — AMLODIPINE BESYLATE 10 MG PO TABS
10.0000 mg | ORAL_TABLET | Freq: Every day | ORAL | 3 refills | Status: DC
Start: 2019-12-15 — End: 2020-01-14

## 2019-12-15 NOTE — Progress Notes (Signed)
Phone [phone] In person visit   Subjective:   John Hutchinson. is a 86 y.o. year old very pleasant male patient who presents for/with See problem oriented charting Chief Complaint  Patient presents with  . Rectum Swollen    Notes says bilateral pt states that its his L side.   . Loss Of Appetite    Patient states that he can't really eat.  . Other    Pt states that when he goes to the bathroom he has to do both at once he can' just do one at a time    This visit occurred during the SARS-CoV-2 public health emergency.  Safety protocols were in place, including screening questions prior to the visit, additional usage of staff PPE, and extensive cleaning of exam room while observing appropriate contact time as indicated for disinfecting solutions.   Past Medical History-  Patient Active Problem List   Diagnosis Date Noted  . Anemia 05/16/2017    Priority: High  . Weight loss 05/16/2017    Priority: High  . Goals of care, counseling/discussion 09/29/2015    Priority: High  . (HFpEF) heart failure with preserved ejection fraction ([facility]) 03/09/2014    Priority: High  . BPH (benign prostatic hyperplasia) 03/09/2014    Priority: Medium  . Degenerative arthritis 09/28/2012    Priority: Medium  . Hyperlipidemia 08/26/2006    Priority: Medium  . Essential hypertension 08/26/2006    Priority: Medium  . CKD (chronic kidney disease), stage II 02/17/2014    Priority: Low  . PROSTATE SPECIFIC ANTIGEN, ELEVATED 06/24/2006    Priority: Low    Medications- reviewed and updated Current Outpatient Medications  Medication Sig Dispense Refill  . amLODipine (NORVASC) 10 MG tablet Take 1 tablet (10 mg total) by mouth daily. 90 tablet 3  . atorvastatin (LIPITOR) 20 MG tablet Take 1 tablet (20 mg total) by mouth daily. 90 tablet 3  . carvedilol (COREG) 25 MG tablet TAKE 1 TABLET BY MOUTH TWICE DAILY WITH A MEAL 180 tablet 3  . furosemide (LASIX) 40 MG tablet Take 1 tablet (40 mg total) by  mouth daily. 90 tablet 3  . losartan (COZAAR) 100 MG tablet Take 1 tablet (100 mg total) by mouth daily. 90 tablet 3  . Multiple Vitamin (MULTIVITAMIN) capsule Take 1 capsule by mouth daily.      . tamsulosin (FLOMAX) 0.4 MG CAPS capsule Take 1 capsule (0.4 mg total) by mouth daily. 90 capsule 3  . traMADol (ULTRAM) 50 MG tablet Take 1 tablet (50 mg total) by mouth every 4 (four) hours. Possible rectal cancer 120 tablet 0   No current facility-administered medications for this visit.     Objective:  BP (!) 115/57   Pulse 82   Temp 99.5 F (37.5 C) (Temporal)   Ht 5\' 4"  (1.626 m)   SpO2 99%   BMI 30.90 kg/m  Gen: NAD, resting comfortably CV: RRR no murmurs rubs or gallops Lungs: CTAB no crackles, wheeze, rhonchi Abdomen: soft/nontender/nondistended/normal bowel sounds. No rebound or guarding.  Ext: no edema Skin: warm, dry Rectal: Large rectal mass noted at least 7 x 5 cm in breadth Neuro: In wheelchair-with significant effort and with me and his son assisting he is able to stand for rectal exam    Assessment and Plan   #hypertension S: medication: amlodipine 10 mg, coreg 25 mg twice daily, lasix 40mg  now just daily, losartan 100mg  Home readings #s: 130s BP Readings from Last 3 Encounters:  12/15/19 (!) 115/57  07/15/19 (!) 139/59  01/05/19 138/72  A/P: Well-controlled-slightly lower in office but typically in the 130s at home-opted to continue current medication for now  #hyperlipidemia S: Medication: atorvastatin 20 mg  Lab Results  Component Value Date   CHOL 157 01/05/2019   HDL 44.30 01/05/2019   LDLCALC 90 01/05/2019   LDLDIRECT 104.0 03/28/2016   TRIG 114.0 01/05/2019   CHOLHDL 4 01/05/2019   A/P: Reasonable control on last check considering his age-continue current medication  #flomax for BPH- sounds like improved controlled  # Swollen rectum On the left #Unintentional weight loss S:Last virtual visit in July patient complained of straining after using  the bathroom and anus feeling sore/tendre/swollen- hemorrhoid per his report. Recommended witch hazel and hydrocortisone.  Recommended follow-up if fails to improve   Patient presents today stating symptoms have not improved at all.very uncomfortable seated-tramadol does not always control his pain. Not hard when he has a bowel movement and goes twice a day. Family reports 4-5 x a day. Patient also notes he typically has to urinate and defecate at the same time. No BRBPR or melena.    He states gets so sore in his rectum with eating that makes him want to eat less.  Overall he has had decreased appetite and ongoing weight loss.  We had discussed unintentional weight loss down to 166 from mid 180s in 2019-at that time patient did not want a work-up prior elevated PSA.Prior elevated PSA and benign biopsy 2008. Has not had prior nocturia of 3-4x a night- states does not wake up anymore to urinate.    He had never been screened for colon cancer and had declined that in 2019 we discussed weight loss and possible colon cancer.  Quality of life is very important to him.  His main concern today is the level of pain he is having.  My main concern and family's main concern prior to exam was worsening weakness (Walked into visit last year. Stopped being able to walk a few months ago) - Couldn't stand to weigh today. Family thinks 180 last visit was an overestimate. He was 167 last June. They have not been able to stand to weigh but have seen him thinning and suspect he is around 150.   Does not particularly want to see GI or surgery from colon cancer perspective- he would be open if it would help pain.  Taking tramadol twice a day at times with some relief.  Did not feel like additional Tylenol was helpful  Patient with prior mild anemia-he is agreeable to lab work to reassess  Also had prior elevated PSA in 2008-8.18-he is agreeable to repeat PSA  Notes from family- not taking meds, not eating meals, stays  cold, rectal swelling, BM and urination same time, some confusion, requests nurse to help with meds, cannot sit long due to discomfort. Weak, swelling in legs and feet.    A/P: 86 year old male with rectal soreness per report as well as unintentional weight loss and nausea.  On exam has significant rectal mass concerning for rectal cancer.  Patient's main goal is to remain comfortable and do as many things he can as possible from the home.  He is not sure he would want to have surgery unless it would significantly improve pain even if could be life preserving.  He seemed open to potential palliative care/hospice consult.  We opted to get some baseline labs for unintentional weight loss though I think the main concern is the rectal lesion. -We  will try to get stat CT scan-instructed [facility] team to alert scheduler- want to fully assess lesion before making decisions- if localized may be worth referral to surgery. Also considering hematology -declines HIV, HCV test -flush able wipe may be helpful for soreness.  We have previously discussed possibly using Vaseline if this was rectal irritation but on exam with rectal mass I do not think this will be helpful   With ongoing weakness and inability to stand-will refer to home health.  Family also request medication assistance-nursing ordered.  Also wonder if pharmacy visit would be helpful  Recommended follow up: Has January visit-we will keep that for now but may need sooner follow-up Future Appointments  Date Time Provider [place]  12/15/2019  2:15 PM [facility] 2 [facility]  01/17/2020  1:20 PM Marin Olp, MD [facility]   Lab/Order associations:   ICD-10-CM   1. Unintentional weight loss  R63.4 CBC with Differential/Platelet    Comprehensive metabolic panel    TSH    Sedimentation rate    C-reactive protein    POCT Urinalysis Dipstick (Automated)    PSA    Ambulatory referral to [facility]    CBC with Differential/Platelet     Comprehensive metabolic panel    TSH    Sedimentation rate    C-reactive protein    PSA  2. Chronic heart failure with preserved ejection fraction (HCC)  I50.32 Ambulatory referral to Home Health    furosemide (LASIX) 40 MG tablet  3. Rectal mass  K62.89 Ambulatory referral to [facility]    CT Abdomen Pelvis W Contrast  4. Elevated PSA  R97.20 PSA    PSA    Meds ordered this encounter  Medications  . furosemide (LASIX) 40 MG tablet    Sig: Take 1 tablet (40 mg total) by mouth daily.    Dispense:  90 tablet    Refill:  3  . amLODipine (NORVASC) 10 MG tablet    Sig: Take 1 tablet (10 mg total) by mouth daily.    Dispense:  90 tablet    Refill:  3  . atorvastatin (LIPITOR) 20 MG tablet    Sig: Take 1 tablet (20 mg total) by mouth daily.    Dispense:  90 tablet    Refill:  3  . carvedilol (COREG) 25 MG tablet    Sig: TAKE 1 TABLET BY MOUTH TWICE DAILY WITH A MEAL    Dispense:  180 tablet    Refill:  3  . losartan (COZAAR) 100 MG tablet    Sig: Take 1 tablet (100 mg total) by mouth daily.    Dispense:  90 tablet    Refill:  3  . tamsulosin (FLOMAX) 0.4 MG CAPS capsule    Sig: Take 1 capsule (0.4 mg total) by mouth daily.    Dispense:  90 capsule    Refill:  3  . traMADol (ULTRAM) 50 MG tablet    Sig: Take 1 tablet (50 mg total) by mouth every 4 (four) hours. Possible rectal cancer    Dispense:  120 tablet    Refill:  0   Time Spent: 52 minutes of total time (8:55 AM-9:47 AM) was spent on the date of the encounter performing the following actions: chart review prior to seeing the patient, obtaining history, performing a medically necessary exam, counseling on the treatment plan, placing orders, and documenting in our EHR.   Return precautions advised.  [name], MD

## 2019-12-15 NOTE — Patient Instructions (Addendum)
Can increase tramadol to every 4-6 hours to see if that helps with pain  Please stop by lab before you go If you have mychart- we will send your results within 3 business days of [place] receiving them.  If you do not have mychart- we will call you about results within 5 business days of [place] receiving them.  *please note we are currently using [facility] which has a longer processing time than [place] typically so labs may not come back as quickly as in the past *please also note that you will see labs on mychart as soon as they post. I will later go in and write notes on them- will say "notes from Dr. Yong Channel"  We will call you within two weeks about your referral for CT scan and home health. If you do not hear within 3 weeks, give [place] a call.   May need to refer to general surgery and/or palliative care consult depending on what we find.   Recommended follow up: as needed for new or worsening symptoms- lets get a game plan together based on labs and imaging

## 2019-12-16 ENCOUNTER — Encounter: Payer: Self-pay | Admitting: Family Medicine

## 2019-12-16 DIAGNOSIS — I700 Atherosclerosis of aorta: Secondary | ICD-10-CM | POA: Insufficient documentation

## 2019-12-16 LAB — CBC WITH DIFFERENTIAL/PLATELET
Absolute Monocytes: 877 cells/uL (ref 200–950)
Basophils Absolute: 65 cells/uL (ref 0–200)
Basophils Relative: 0.5 %
Eosinophils Absolute: 1613 cells/uL — ABNORMAL HIGH (ref 15–500)
Eosinophils Relative: 12.5 %
HCT: 26 % — ABNORMAL LOW (ref 38.5–50.0)
Hemoglobin: 8.2 g/dL — ABNORMAL LOW (ref 13.2–17.1)
Lymphs Abs: 761 cells/uL — ABNORMAL LOW (ref 850–3900)
MCH: 27.3 pg (ref 27.0–33.0)
MCHC: 31.5 g/dL — ABNORMAL LOW (ref 32.0–36.0)
MCV: 86.7 fL (ref 80.0–100.0)
MPV: 9.7 fL (ref 7.5–12.5)
Monocytes Relative: 6.8 %
Neutro Abs: 9585 cells/uL — ABNORMAL HIGH (ref 1500–7800)
Neutrophils Relative %: 74.3 %
Platelets: 423 10*3/uL — ABNORMAL HIGH (ref 140–400)
RBC: 3 10*6/uL — ABNORMAL LOW (ref 4.20–5.80)
RDW: 16 % — ABNORMAL HIGH (ref 11.0–15.0)
Total Lymphocyte: 5.9 %
WBC: 12.9 10*3/uL — ABNORMAL HIGH (ref 3.8–10.8)

## 2019-12-16 LAB — COMPREHENSIVE METABOLIC PANEL
AG Ratio: 1 (calc) (ref 1.0–2.5)
ALT: 8 U/L — ABNORMAL LOW (ref 9–46)
AST: 26 U/L (ref 10–35)
Albumin: 3.1 g/dL — ABNORMAL LOW (ref 3.6–5.1)
Alkaline phosphatase (APISO): 198 U/L — ABNORMAL HIGH (ref 35–144)
BUN/Creatinine Ratio: 13 (calc) (ref 6–22)
BUN: 22 mg/dL (ref 7–25)
CO2: 29 mmol/L (ref 20–32)
Calcium: 8.6 mg/dL (ref 8.6–10.3)
Chloride: 103 mmol/L (ref 98–110)
Creat: 1.71 mg/dL — ABNORMAL HIGH (ref 0.70–1.11)
Globulin: 3.1 g/dL (calc) (ref 1.9–3.7)
Glucose, Bld: 112 mg/dL — ABNORMAL HIGH (ref 65–99)
Potassium: 4.7 mmol/L (ref 3.5–5.3)
Sodium: 142 mmol/L (ref 135–146)
Total Bilirubin: 0.6 mg/dL (ref 0.2–1.2)
Total Protein: 6.2 g/dL (ref 6.1–8.1)

## 2019-12-16 LAB — PSA: PSA: 11.41 ng/mL — ABNORMAL HIGH (ref <=4.0)

## 2019-12-16 LAB — C-REACTIVE PROTEIN: CRP: 140.1 mg/L — ABNORMAL HIGH (ref <8.0)

## 2019-12-16 LAB — SEDIMENTATION RATE: Sed Rate: 97 mm/h — ABNORMAL HIGH (ref 0–20)

## 2019-12-16 LAB — TSH: TSH: 4.45 mIU/L (ref 0.40–4.50)

## 2019-12-22 ENCOUNTER — Telehealth: Payer: Self-pay

## 2019-12-22 DIAGNOSIS — M1990 Unspecified osteoarthritis, unspecified site: Secondary | ICD-10-CM | POA: Diagnosis not present

## 2019-12-22 DIAGNOSIS — K6289 Other specified diseases of anus and rectum: Secondary | ICD-10-CM | POA: Diagnosis not present

## 2019-12-22 DIAGNOSIS — I130 Hypertensive heart and chronic kidney disease with heart failure and stage 1 through stage 4 chronic kidney disease, or unspecified chronic kidney disease: Secondary | ICD-10-CM | POA: Diagnosis not present

## 2019-12-22 DIAGNOSIS — N182 Chronic kidney disease, stage 2 (mild): Secondary | ICD-10-CM | POA: Diagnosis not present

## 2019-12-22 DIAGNOSIS — R634 Abnormal weight loss: Secondary | ICD-10-CM | POA: Diagnosis not present

## 2019-12-22 DIAGNOSIS — I5032 Chronic diastolic (congestive) heart failure: Secondary | ICD-10-CM | POA: Diagnosis not present

## 2019-12-22 DIAGNOSIS — N400 Enlarged prostate without lower urinary tract symptoms: Secondary | ICD-10-CM | POA: Diagnosis not present

## 2019-12-22 DIAGNOSIS — D649 Anemia, unspecified: Secondary | ICD-10-CM | POA: Diagnosis not present

## 2019-12-22 DIAGNOSIS — E785 Hyperlipidemia, unspecified: Secondary | ICD-10-CM | POA: Diagnosis not present

## 2019-12-22 NOTE — Telephone Encounter (Signed)
Completed PT eval today.  Will pick patient up on trial bases for PT.   Few things: 1.  Does not think patient is interested in PT 2.  Patient comes across as hospice care or facility care to receive PT everyday based on pts medical history 3.  Asked patient what his goals were and patient responded to get rid of pain 4.  States patient can not sit still due to rectum issues  5.  States patient is dependent for assistance 6.  States looking at patients home environment it looks like family is not able to provide the care patient needs

## 2019-12-22 NOTE — Telephone Encounter (Signed)
Had discussion today with patient.  He is undecided if he wants to "do everything" to try to prolong life versus try to remain comfortable at home and consider hospice.  I had a few ideas after talking to him 1.  Schedule a virtual visit with multiple family members involved including wife and son 2.  Consider palliative care visit and have them come out to the house to discuss options with patient-also would like family there if possible  Please call and offer these 2 options and see if patient has a preference

## 2019-12-22 NOTE — Telephone Encounter (Signed)
See below

## 2019-12-22 NOTE — Telephone Encounter (Signed)
Called and spoke with the patients wife and she stated that her husband was sleep so they would give [place] a call back about what he wanted to do tomorrow.

## 2019-12-24 ENCOUNTER — Telehealth: Payer: Self-pay

## 2019-12-24 DIAGNOSIS — N182 Chronic kidney disease, stage 2 (mild): Secondary | ICD-10-CM | POA: Diagnosis not present

## 2019-12-24 DIAGNOSIS — K6289 Other specified diseases of anus and rectum: Secondary | ICD-10-CM | POA: Diagnosis not present

## 2019-12-24 DIAGNOSIS — I5032 Chronic diastolic (congestive) heart failure: Secondary | ICD-10-CM | POA: Diagnosis not present

## 2019-12-24 DIAGNOSIS — R634 Abnormal weight loss: Secondary | ICD-10-CM | POA: Diagnosis not present

## 2019-12-24 DIAGNOSIS — I130 Hypertensive heart and chronic kidney disease with heart failure and stage 1 through stage 4 chronic kidney disease, or unspecified chronic kidney disease: Secondary | ICD-10-CM | POA: Diagnosis not present

## 2019-12-24 DIAGNOSIS — D649 Anemia, unspecified: Secondary | ICD-10-CM | POA: Diagnosis not present

## 2019-12-24 NOTE — Telephone Encounter (Signed)
Pt is requesting a call in regards to some information that he and Dr. Yong Channel discussed

## 2019-12-24 NOTE — Telephone Encounter (Signed)
Did you call personally or is OV needed to discuss?

## 2019-12-24 NOTE — Telephone Encounter (Signed)
Spoke with patient. At present he wants to continue his current care at home. Tramadol is reasonably controlling his pain.   Despite rectal cancer and his rectal mass related to this- He does not want to do an oncology or surgery visit. He prefers to die at home in comfort. He is not ready for palliative care or hospice.   We do have a January visit and we will plan to follow up at that time. He can certainly call me and we can rediscuss.

## 2019-12-24 NOTE — Telephone Encounter (Signed)
Patients wife called again and states [facility] called about end of life and states they do not want that if you have any questions feel free to call back

## 2019-12-28 DIAGNOSIS — D649 Anemia, unspecified: Secondary | ICD-10-CM | POA: Diagnosis not present

## 2019-12-28 DIAGNOSIS — I5032 Chronic diastolic (congestive) heart failure: Secondary | ICD-10-CM | POA: Diagnosis not present

## 2019-12-28 DIAGNOSIS — N182 Chronic kidney disease, stage 2 (mild): Secondary | ICD-10-CM | POA: Diagnosis not present

## 2019-12-28 DIAGNOSIS — K6289 Other specified diseases of anus and rectum: Secondary | ICD-10-CM | POA: Diagnosis not present

## 2019-12-28 DIAGNOSIS — I130 Hypertensive heart and chronic kidney disease with heart failure and stage 1 through stage 4 chronic kidney disease, or unspecified chronic kidney disease: Secondary | ICD-10-CM | POA: Diagnosis not present

## 2019-12-28 DIAGNOSIS — R634 Abnormal weight loss: Secondary | ICD-10-CM | POA: Diagnosis not present

## 2019-12-29 DIAGNOSIS — D649 Anemia, unspecified: Secondary | ICD-10-CM | POA: Diagnosis not present

## 2019-12-29 DIAGNOSIS — N182 Chronic kidney disease, stage 2 (mild): Secondary | ICD-10-CM | POA: Diagnosis not present

## 2019-12-29 DIAGNOSIS — I5032 Chronic diastolic (congestive) heart failure: Secondary | ICD-10-CM | POA: Diagnosis not present

## 2019-12-29 DIAGNOSIS — I130 Hypertensive heart and chronic kidney disease with heart failure and stage 1 through stage 4 chronic kidney disease, or unspecified chronic kidney disease: Secondary | ICD-10-CM | POA: Diagnosis not present

## 2019-12-29 DIAGNOSIS — K6289 Other specified diseases of anus and rectum: Secondary | ICD-10-CM | POA: Diagnosis not present

## 2019-12-29 DIAGNOSIS — R634 Abnormal weight loss: Secondary | ICD-10-CM | POA: Diagnosis not present

## 2019-12-31 ENCOUNTER — Telehealth: Payer: Self-pay

## 2019-12-31 NOTE — Telephone Encounter (Signed)
Dr Yong Channel is aware.Thanks for the update.

## 2019-12-31 NOTE — Telephone Encounter (Signed)
[facility] called to let us know that Pt is missing his physical therapy, due to not feeling up to it today.

## 2020-01-05 DIAGNOSIS — I5032 Chronic diastolic (congestive) heart failure: Secondary | ICD-10-CM | POA: Diagnosis not present

## 2020-01-05 DIAGNOSIS — R634 Abnormal weight loss: Secondary | ICD-10-CM | POA: Diagnosis not present

## 2020-01-05 DIAGNOSIS — I130 Hypertensive heart and chronic kidney disease with heart failure and stage 1 through stage 4 chronic kidney disease, or unspecified chronic kidney disease: Secondary | ICD-10-CM | POA: Diagnosis not present

## 2020-01-05 DIAGNOSIS — K6289 Other specified diseases of anus and rectum: Secondary | ICD-10-CM | POA: Diagnosis not present

## 2020-01-05 DIAGNOSIS — D649 Anemia, unspecified: Secondary | ICD-10-CM | POA: Diagnosis not present

## 2020-01-05 DIAGNOSIS — N182 Chronic kidney disease, stage 2 (mild): Secondary | ICD-10-CM | POA: Diagnosis not present

## 2020-01-06 ENCOUNTER — Telehealth: Payer: Self-pay

## 2020-01-06 NOTE — Telephone Encounter (Signed)
Noted thanks °

## 2020-01-06 NOTE — Telephone Encounter (Signed)
See below

## 2020-01-06 NOTE — Telephone Encounter (Signed)
Linton Rump is calling in from [place] that the patient was not feeling good so they missed a visit this week.

## 2020-01-11 ENCOUNTER — Encounter (HOSPITAL_COMMUNITY): Payer: Self-pay

## 2020-01-11 ENCOUNTER — Telehealth: Payer: Self-pay

## 2020-01-11 ENCOUNTER — Inpatient Hospital Stay (HOSPITAL_COMMUNITY)
Admission: EM | Admit: 2020-01-11 | Discharge: 2020-01-14 | DRG: 951 | Disposition: A | Discharge status: Hospice | Payer: Medicare Other | Attending: Internal Medicine | Admitting: Internal Medicine

## 2020-01-11 ENCOUNTER — Emergency Department (HOSPITAL_COMMUNITY): Payer: Medicare Other

## 2020-01-11 ENCOUNTER — Other Ambulatory Visit: Payer: Self-pay

## 2020-01-11 DIAGNOSIS — N1832 Chronic kidney disease, stage 3b: Secondary | ICD-10-CM | POA: Diagnosis not present

## 2020-01-11 DIAGNOSIS — N182 Chronic kidney disease, stage 2 (mild): Secondary | ICD-10-CM | POA: Diagnosis not present

## 2020-01-11 DIAGNOSIS — E861 Hypovolemia: Secondary | ICD-10-CM | POA: Diagnosis present

## 2020-01-11 DIAGNOSIS — Z823 Family history of stroke: Secondary | ICD-10-CM

## 2020-01-11 DIAGNOSIS — C20 Malignant neoplasm of rectum: Secondary | ICD-10-CM | POA: Diagnosis not present

## 2020-01-11 DIAGNOSIS — R410 Disorientation, unspecified: Secondary | ICD-10-CM | POA: Diagnosis not present

## 2020-01-11 DIAGNOSIS — E785 Hyperlipidemia, unspecified: Secondary | ICD-10-CM | POA: Diagnosis present

## 2020-01-11 DIAGNOSIS — R0689 Other abnormalities of breathing: Secondary | ICD-10-CM | POA: Diagnosis not present

## 2020-01-11 DIAGNOSIS — R627 Adult failure to thrive: Secondary | ICD-10-CM | POA: Diagnosis not present

## 2020-01-11 DIAGNOSIS — I130 Hypertensive heart and chronic kidney disease with heart failure and stage 1 through stage 4 chronic kidney disease, or unspecified chronic kidney disease: Secondary | ICD-10-CM | POA: Diagnosis not present

## 2020-01-11 DIAGNOSIS — D630 Anemia in neoplastic disease: Secondary | ICD-10-CM | POA: Diagnosis present

## 2020-01-11 DIAGNOSIS — I5032 Chronic diastolic (congestive) heart failure: Secondary | ICD-10-CM | POA: Diagnosis not present

## 2020-01-11 DIAGNOSIS — G319 Degenerative disease of nervous system, unspecified: Secondary | ICD-10-CM | POA: Diagnosis not present

## 2020-01-11 DIAGNOSIS — R451 Restlessness and agitation: Secondary | ICD-10-CM | POA: Diagnosis present

## 2020-01-11 DIAGNOSIS — R531 Weakness: Secondary | ICD-10-CM | POA: Diagnosis not present

## 2020-01-11 DIAGNOSIS — C785 Secondary malignant neoplasm of large intestine and rectum: Secondary | ICD-10-CM | POA: Diagnosis present

## 2020-01-11 DIAGNOSIS — Z880 Allergy status to penicillin: Secondary | ICD-10-CM

## 2020-01-11 DIAGNOSIS — Z20822 Contact with and (suspected) exposure to covid-19: Secondary | ICD-10-CM | POA: Diagnosis not present

## 2020-01-11 DIAGNOSIS — Z66 Do not resuscitate: Secondary | ICD-10-CM | POA: Diagnosis not present

## 2020-01-11 DIAGNOSIS — D649 Anemia, unspecified: Secondary | ICD-10-CM | POA: Diagnosis present

## 2020-01-11 DIAGNOSIS — R634 Abnormal weight loss: Secondary | ICD-10-CM | POA: Diagnosis not present

## 2020-01-11 DIAGNOSIS — Z515 Encounter for palliative care: Principal | ICD-10-CM

## 2020-01-11 DIAGNOSIS — Z825 Family history of asthma and other chronic lower respiratory diseases: Secondary | ICD-10-CM

## 2020-01-11 DIAGNOSIS — Z87891 Personal history of nicotine dependence: Secondary | ICD-10-CM

## 2020-01-11 DIAGNOSIS — Z87440 Personal history of urinary (tract) infections: Secondary | ICD-10-CM

## 2020-01-11 DIAGNOSIS — R5381 Other malaise: Secondary | ICD-10-CM | POA: Diagnosis not present

## 2020-01-11 DIAGNOSIS — I6782 Cerebral ischemia: Secondary | ICD-10-CM | POA: Diagnosis not present

## 2020-01-11 DIAGNOSIS — D170 Benign lipomatous neoplasm of skin and subcutaneous tissue of head, face and neck: Secondary | ICD-10-CM | POA: Diagnosis not present

## 2020-01-11 DIAGNOSIS — K6289 Other specified diseases of anus and rectum: Secondary | ICD-10-CM

## 2020-01-11 DIAGNOSIS — I10 Essential (primary) hypertension: Secondary | ICD-10-CM | POA: Diagnosis not present

## 2020-01-11 DIAGNOSIS — D75839 Thrombocytosis, unspecified: Secondary | ICD-10-CM | POA: Diagnosis present

## 2020-01-11 DIAGNOSIS — Z79899 Other long term (current) drug therapy: Secondary | ICD-10-CM

## 2020-01-11 DIAGNOSIS — I5030 Unspecified diastolic (congestive) heart failure: Secondary | ICD-10-CM | POA: Diagnosis present

## 2020-01-11 DIAGNOSIS — Z6830 Body mass index (BMI) 30.0-30.9, adult: Secondary | ICD-10-CM

## 2020-01-11 DIAGNOSIS — I959 Hypotension, unspecified: Secondary | ICD-10-CM | POA: Diagnosis present

## 2020-01-11 LAB — CBC WITH DIFFERENTIAL/PLATELET
Abs Immature Granulocytes: 0.11 10*3/uL — ABNORMAL HIGH (ref 0.00–0.07)
Basophils Absolute: 0 10*3/uL (ref 0.0–0.1)
Basophils Relative: 0 %
Eosinophils Absolute: 0.1 10*3/uL (ref 0.0–0.5)
Eosinophils Relative: 1 %
HCT: 24.5 % — ABNORMAL LOW (ref 39.0–52.0)
Hemoglobin: 6.9 g/dL — CL (ref 13.0–17.0)
Immature Granulocytes: 1 %
Lymphocytes Relative: 6 %
Lymphs Abs: 0.8 10*3/uL (ref 0.7–4.0)
MCH: 25.1 pg — ABNORMAL LOW (ref 26.0–34.0)
MCHC: 28.2 g/dL — ABNORMAL LOW (ref 30.0–36.0)
MCV: 89.1 fL (ref 80.0–100.0)
Monocytes Absolute: 1.3 10*3/uL — ABNORMAL HIGH (ref 0.1–1.0)
Monocytes Relative: 9 %
Neutro Abs: 11.6 10*3/uL — ABNORMAL HIGH (ref 1.7–7.7)
Neutrophils Relative %: 83 %
Platelets: 509 10*3/uL — ABNORMAL HIGH (ref 150–400)
RBC: 2.75 MIL/uL — ABNORMAL LOW (ref 4.22–5.81)
RDW: 18.7 % — ABNORMAL HIGH (ref 11.5–15.5)
WBC: 13.9 10*3/uL — ABNORMAL HIGH (ref 4.0–10.5)
nRBC: 0 % (ref 0.0–0.2)

## 2020-01-11 LAB — COMPREHENSIVE METABOLIC PANEL
ALT: 12 U/L (ref 0–44)
AST: 35 U/L (ref 15–41)
Albumin: 1.8 g/dL — ABNORMAL LOW (ref 3.5–5.0)
Alkaline Phosphatase: 324 U/L — ABNORMAL HIGH (ref 38–126)
Anion gap: 15 (ref 5–15)
BUN: 29 mg/dL — ABNORMAL HIGH (ref 8–23)
CO2: 25 mmol/L (ref 22–32)
Calcium: 8.4 mg/dL — ABNORMAL LOW (ref 8.9–10.3)
Chloride: 94 mmol/L — ABNORMAL LOW (ref 98–111)
Creatinine, Ser: 1.88 mg/dL — ABNORMAL HIGH (ref 0.61–1.24)
GFR, Estimated: 35 mL/min — ABNORMAL LOW (ref >60)
Glucose, Bld: 152 mg/dL — ABNORMAL HIGH (ref 70–99)
Potassium: 5.3 mmol/L — ABNORMAL HIGH (ref 3.5–5.1)
Sodium: 134 mmol/L — ABNORMAL LOW (ref 135–145)
Total Bilirubin: 0.6 mg/dL (ref 0.3–1.2)
Total Protein: 6.2 g/dL — ABNORMAL LOW (ref 6.5–8.1)

## 2020-01-11 LAB — TROPONIN I (HIGH SENSITIVITY)
Troponin I (High Sensitivity): 9 ng/L (ref <18)
Troponin I (High Sensitivity): 7 ng/L (ref <18)

## 2020-01-11 LAB — PREPARE RBC (CROSSMATCH)

## 2020-01-11 LAB — ABO/RH: ABO/RH(D): A POS

## 2020-01-11 MED ORDER — MORPHINE SULFATE (PF) 2 MG/ML IV SOLN: 2.0000 mg | Freq: Once | INTRAVENOUS | Status: DC

## 2020-01-11 MED ORDER — ALUM & MAG HYDROXIDE-SIMETH 200-200-20 MG/5ML PO SUSP: 30.0000 mL | Freq: Four times a day (QID) | ORAL | Status: DC | PRN

## 2020-01-11 MED ORDER — MORPHINE SULFATE (CONCENTRATE) 10 MG/0.5ML PO SOLN: 5.0000 mg | ORAL | Status: DC | PRN

## 2020-01-11 MED ORDER — OLANZAPINE 5 MG PO TBDP
5.0000 mg | ORAL_TABLET | Freq: Every day | ORAL | Status: DC
  Administered 2020-01-11 – 2020-01-14 (×4): 5 mg via ORAL
  Filled 2020-01-11 (×4): qty 1

## 2020-01-11 MED ORDER — MORPHINE SULFATE (PF) 4 MG/ML IV SOLN
3.0000 mg | INTRAVENOUS | Status: DC | PRN
  Administered 2020-01-12: 3 mg via INTRAVENOUS
  Filled 2020-01-11: qty 1

## 2020-01-11 MED ORDER — SODIUM CHLORIDE 0.9% IV SOLUTION: Freq: Once | INTRAVENOUS | Status: AC

## 2020-01-11 MED ORDER — LORAZEPAM 2 MG/ML IJ SOLN
1.0000 mg | INTRAMUSCULAR | Status: DC | PRN
  Administered 2020-01-14 (×2): 1 mg via INTRAVENOUS
  Filled 2020-01-11 (×2): qty 1

## 2020-01-11 MED ORDER — ONDANSETRON HCL 4 MG/2ML IJ SOLN: 4.0000 mg | Freq: Once | INTRAMUSCULAR | Status: DC

## 2020-01-11 MED ORDER — LORAZEPAM 2 MG/ML PO CONC: 1.0000 mg | ORAL | Status: DC | PRN

## 2020-01-11 MED ORDER — ONDANSETRON 4 MG PO TBDP: 4.0000 mg | ORAL_TABLET | Freq: Four times a day (QID) | ORAL | Status: DC | PRN

## 2020-01-11 MED ORDER — LORAZEPAM 2 MG/ML IJ SOLN
1.0000 mg | INTRAMUSCULAR | Status: DC | PRN
  Administered 2020-01-12 (×2): 1 mg via INTRAVENOUS
  Filled 2020-01-11 (×2): qty 1

## 2020-01-11 MED ORDER — MORPHINE SULFATE (PF) 2 MG/ML IV SOLN
2.0000 mg | Freq: Once | INTRAVENOUS | Status: AC
  Administered 2020-01-11: 22:00:00 2 mg via INTRAMUSCULAR
  Filled 2020-01-11 (×2): qty 1

## 2020-01-11 MED ORDER — SODIUM CHLORIDE 0.9 % IV BOLUS
500.0000 mL | Freq: Once | INTRAVENOUS | Status: AC
  Administered 2020-01-12: 02:00:00 500 mL via INTRAVENOUS

## 2020-01-11 MED ORDER — LORAZEPAM 1 MG PO TABS
1.0000 mg | ORAL_TABLET | ORAL | Status: DC | PRN
  Administered 2020-01-11 – 2020-01-13 (×2): 1 mg via ORAL
  Filled 2020-01-11 (×2): qty 1

## 2020-01-11 MED ORDER — ONDANSETRON HCL 4 MG/2ML IJ SOLN: 4.0000 mg | Freq: Four times a day (QID) | INTRAMUSCULAR | Status: DC | PRN

## 2020-01-11 NOTE — H&P (Signed)
History and Physical    John Hutchinson. OZD:[id number] DOB: [birth year] DOA: 01/11/2020  PCP: Marin Olp, MD   Patient coming from: Home   Chief Complaint: Pain, general weakness   HPI: John Hutchinson. is a 86 y.o. male with medical history significant for hypertension, chronic diastolic CHF, chronic kidney disease, and recently diagnosed metastatic rectal cancer, now presenting to the emergency department with worsening pain and fatigue.  Patient's spouse and son were contacted by phone and provide much of the history.  Patient has been increasingly fatigued and generally weak over the course of several months, has not walked in on his own in a few months, and has been refusing to participate in physical therapy.  He had a large rectal mass noted on physical exam recently and outpatient CT was concerning for metastatic rectal cancer.  Patient has indicated that he is not interested in any treatment for this.  Patient's family supports his decision to focus on comfort only.  Family requests help arranging hospice if he qualifies.  ED Course: Upon arrival to the ED, patient is found to be afebrile, saturating well on room air, and with blood pressure 96/51.  EKG features a sinus rhythm and head CT is negative for acute intracranial abnormality.  Chemistry panel is notable for creatinine 1.88, potassium 5.3, albumin 1.8, and alkaline phosphatase 324.  CBC is notable for leukocytosis, thrombocytosis, and hemoglobin of 6.9.  Troponin is normal.  1 unit of RBC, morphine, and palliative care consultation was ordered by the emergency department physician.  Review of Systems:  All other systems reviewed and apart from HPI, are negative.  Past Medical History:  Diagnosis Date  . Arthritis   . Gallstones   . HYPERLIPIDEMIA 08/26/2006  . HYPERTENSION 08/26/2006  . PROSTATE SPECIFIC ANTIGEN, ELEVATED 06/24/2006    Past Surgical History:  Procedure Laterality Date  . HERNIA REPAIR    .  PROSTATE BIOPSY    . TONSILLECTOMY      Social History:   reports that he quit smoking about 65 years ago. His smoking use included cigarettes. He has a 3.00 pack-year smoking history. He has never used smokeless tobacco. He reports that he does not drink alcohol and does not use drugs.  Allergies  Allergen Reactions  . Penicillins Other (See Comments)    unknown    Family History  Problem Relation Age of Onset  . Stroke Mother        72s, smoker  . COPD Father   . Emphysema Father      Prior to Admission medications   Medication Sig Start Date End Date Taking? Authorizing Provider  amLODipine (NORVASC) 10 MG tablet Take 1 tablet (10 mg total) by mouth daily. 12/15/19   Marin Olp, MD  atorvastatin (LIPITOR) 20 MG tablet Take 1 tablet (20 mg total) by mouth daily. 12/15/19   Marin Olp, MD  carvedilol (COREG) 25 MG tablet TAKE 1 TABLET BY MOUTH TWICE DAILY WITH A MEAL 12/15/19   Marin Olp, MD  furosemide (LASIX) 40 MG tablet Take 1 tablet (40 mg total) by mouth daily. 12/15/19   Marin Olp, MD  losartan (COZAAR) 100 MG tablet Take 1 tablet (100 mg total) by mouth daily. 12/15/19   Marin Olp, MD  Multiple Vitamin (MULTIVITAMIN) capsule Take 1 capsule by mouth daily.      [provider]  tamsulosin (FLOMAX) 0.4 MG CAPS capsule Take 1 capsule (0.4 mg total) by mouth  daily. 12/15/19   Shelva Majestic, MD  traMADol (ULTRAM) 50 MG tablet Take 1 tablet (50 mg total) by mouth every 4 (four) hours. Possible rectal cancer 12/15/19   Shelva Majestic, MD    Physical Exam: Vitals:   01/11/20 2230 01/11/20 2233 01/11/20 2245 01/11/20 2309  BP:  (!) 90/40 (!) 100/37 (!) 99/49  Pulse:  85 85 84  Resp:  11 14 12   Temp: 97.8 F (36.6 C) 97.9 F (36.6 C)  98.5 F (36.9 C)  TempSrc:  Oral  Oral  SpO2:  100% 100% 100%  Weight:      Height:        Constitutional: NAD, calm  Eyes: PERTLA, lids and conjunctivae normal ENMT: Mucous membranes are  moist. Posterior pharynx clear of any exudate or lesions.   Neck: normal, supple, no masses, no thyromegaly Respiratory:  no wheezing, no crackles. No accessory muscle use.  Cardiovascular: S1 & S2 heard, regular rate and rhythm. No extremity edema.  Abdomen: No distension, no tenderness, soft. Bowel sounds active.  Musculoskeletal: no clubbing / cyanosis. No joint deformity upper and lower extremities.   Skin: no significant rashes, lesions, ulcers. Warm, dry, well-perfused. Neurologic: No gross facial asymmetry. Sensation intact. Moving all extremities.  Psychiatric: Alert and oriented to person, place, and situation. Calm and cooperative.    Labs and Imaging on Admission: I have personally reviewed following labs and imaging studies  CBC: Recent Labs  Lab 01/11/20 1549  WBC 13.9*  NEUTROABS 11.6*  HGB 6.9*  HCT 24.5*  MCV 89.1  PLT 509*   Basic Metabolic Panel: Recent Labs  Lab 01/11/20 1549  NA 134*  K 5.3*  CL 94*  CO2 25  GLUCOSE 152*  BUN 29*  CREATININE 1.88*  CALCIUM 8.4*   GFR: Estimated Creatinine Clearance: 28.2 mL/min (A) (by C-G formula based on SCr of 1.88 mg/dL (H)). Liver Function Tests: Recent Labs  Lab 01/11/20 1549  AST 35  ALT 12  ALKPHOS 324*  BILITOT 0.6  PROT 6.2*  ALBUMIN 1.8*   No results for input(s): LIPASE, AMYLASE in the last 168 hours. No results for input(s): AMMONIA in the last 168 hours. Coagulation Profile: No results for input(s): INR, PROTIME in the last 168 hours. Cardiac Enzymes: No results for input(s): CKTOTAL, CKMB, CKMBINDEX, TROPONINI in the last 168 hours. BNP (last 3 results) No results for input(s): PROBNP in the last 8760 hours. HbA1C: No results for input(s): HGBA1C in the last 72 hours. CBG: No results for input(s): GLUCAP in the last 168 hours. Lipid Profile: No results for input(s): CHOL, HDL, LDLCALC, TRIG, CHOLHDL, LDLDIRECT in the last 72 hours. Thyroid Function Tests: No results for input(s): TSH,  T4TOTAL, FREET4, T3FREE, THYROIDAB in the last 72 hours. Anemia Panel: No results for input(s): VITAMINB12, FOLATE, FERRITIN, TIBC, IRON, RETICCTPCT in the last 72 hours. Urine analysis:    Component Value Date/Time   BILIRUBINUR Negative 01/01/2018 1626   PROTEINUR Negative 01/01/2018 1626   UROBILINOGEN 0.2 01/01/2018 1626   NITRITE Negative 01/01/2018 1626   LEUKOCYTESUR Negative 01/01/2018 1626   Sepsis Labs: @LABRCNTIP (procalcitonin:4,lacticidven:4) )No results found for this or any previous visit (from the past 240 hour(s)).   Radiological Exams on Admission: CT Head Wo Contrast  Result Date: 01/11/2020 CLINICAL DATA:  Delirium. EXAM: CT HEAD WITHOUT CONTRAST TECHNIQUE: Contiguous axial images were obtained from the base of the skull through the vertex without intravenous contrast. COMPARISON:  Head CT 11/07/2013. FINDINGS: Brain: Mild cerebral and cerebellar  atrophy. Mild ill-defined hypoattenuation within the cerebral white matter is nonspecific, but compatible with chronic small vessel ischemic disease. There is no acute intracranial hemorrhage. No demarcated cortical infarct. No extra-axial fluid collection. No evidence of intracranial mass. No midline shift. Vascular: No hyperdense vessel.  Atherosclerotic calcifications. Skull: Normal. Negative for fracture or focal lesion. Sinuses/Orbits: Visualized orbits show no acute finding. Mild scattered ethmoid sinus mucosal thickening. 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 IMPRESSION: No evidence of acute intracranial abnormality. Mild generalized atrophy of the brain, progressed from the head CT of 11/07/2013. Stable mild cerebral white matter chronic small vessel ischemic disease. Mild ethmoid sinus mucosal thickening. 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 Electronically Signed   By: Jackey Loge DO   On: 01/11/2020 19:19    EKG: Independently  reviewed. Sinus rhythm.   Assessment/Plan   1. Symptomatic anemia  - Patient presents with progressive weakness and pain, was recently diagnosed with metastatic rectal cancer for which he does not want treatment but wants a blood transfusion and treatment of his pain; family is supportive of his decision and agrees with transfusing a unit of blood but otherwise focusing on patient's comfort only  - Palliative care was consulted by ED physician  - Continue comfort measures    2. Rectal cancer  - Metastatic rectal cancer recently diagnosed outpatient  - Patient not interested in treatment for this but just wants to be kept comfortable; his family are supportive of his decision    3. CKD IIIb  - SCr is 1.88 on admission, up from 1.71 four weeks ago  - Renally-dose medications   4. Hypertension  - SBP 90s in ED in setting of hypovolemia  - Hold antihypertensives    5. Chronic diastolic CHF  - Appears hypovolemic on admission  - Hold diuretics     DVT prophylaxis: SCD  Code Status: DNR, confirmed on admission  Family Communication: Son and wife updated by phone  Disposition Plan:  Patient is from: Home  Anticipated d/c is to: TBD Anticipated d/c date is: 01/12/20 Patient currently: Pending palliative consultation  Consults called: None  Admission status: Observation     Briscoe Deutscher, MD [facility]  01/11/2020, 11:12 PM

## 2020-01-11 NOTE — ED Provider Notes (Signed)
[facility] EMERGENCY DEPARTMENT Provider Note   CSN: [id number] Arrival date & time: 01/11/20  1542     History Chief Complaint  Patient presents with  . Recurrent UTI    John Hutchinson. is a 86 y.o. male.  86 yo M with a chief complaints of worsening pain all over and a low blood pressure.  I obtained his history from his family.  The patient seems somewhat confused and not able to provide much history.  Level 5 caveat.  Per the son the patient has had somewhat of a progressive decline.  Not really eating and drinking.  Not taking his medicines at home.  Spending most of his time in bed and sleeping most of the day.  Will take tramadol off and on which usually makes him sleepy and he spends a lot of time in bed afterwards.  Complains of pain all over his body.  Anytime he gets touched it hurts him.  He had recently been diagnosed with a rectal mass.  Family was reluctant to get this taken care of and he preferred to go home to die.  They had declined hospice or palliative care.  The history is provided by a relative.  Illness Severity:  Moderate Onset quality:  Gradual Duration:  2 months Timing:  Intermittent Progression:  Worsening Chronicity:  New Associated symptoms: no abdominal pain, no chest pain, no congestion, no diarrhea, no fever, no headaches, no myalgias, no rash, no shortness of breath and no vomiting        Past Medical History:  Diagnosis Date  . Arthritis   . Gallstones   . HYPERLIPIDEMIA 08/26/2006  . HYPERTENSION 08/26/2006  . PROSTATE SPECIFIC ANTIGEN, ELEVATED 06/24/2006    Patient Active Problem List   Diagnosis Date Noted  . Symptomatic anemia 01/11/2020  . Aortic atherosclerosis ([place]) 12/16/2019  . Anemia 05/16/2017  . Weight loss 05/16/2017  . Goals of care, counseling/discussion 09/29/2015  . BPH (benign prostatic hyperplasia) 03/09/2014  . (HFpEF) heart failure with preserved ejection fraction ([place]) 03/09/2014  . CKD  (chronic kidney disease), stage II 02/17/2014  . Degenerative arthritis 09/28/2012  . Hyperlipidemia 08/26/2006  . Essential hypertension 08/26/2006  . PROSTATE SPECIFIC ANTIGEN, ELEVATED 06/24/2006    Past Surgical History:  Procedure Laterality Date  . HERNIA REPAIR    . PROSTATE BIOPSY    . TONSILLECTOMY         Family History  Problem Relation Age of Onset  . Stroke Mother        22s, smoker  . COPD Father   . Emphysema Father     Social History   Tobacco Use  . Smoking status: Former Smoker    Packs/day: 1.00    Years: 3.00    Pack years: 3.00    Types: Cigarettes    Quit date: 01/15/1955    Years since quitting: 65.0  . Smokeless tobacco: Never Used  Vaping Use  . Vaping Use: Never used  Substance Use Topics  . Alcohol use: No    Alcohol/week: 0.0 standard drinks  . Drug use: No    Home Medications Prior to Admission medications   Medication Sig Start Date End Date Taking? Authorizing Provider  amLODipine (NORVASC) 10 MG tablet Take 1 tablet (10 mg total) by mouth daily. 12/15/19   Marin Olp, MD  atorvastatin (LIPITOR) 20 MG tablet Take 1 tablet (20 mg total) by mouth daily. 12/15/19   Marin Olp, MD  carvedilol (COREG)  25 MG tablet TAKE 1 TABLET BY MOUTH TWICE DAILY WITH A MEAL 12/15/19   Shelva Majestic, MD  furosemide (LASIX) 40 MG tablet Take 1 tablet (40 mg total) by mouth daily. 12/15/19   Shelva Majestic, MD  losartan (COZAAR) 100 MG tablet Take 1 tablet (100 mg total) by mouth daily. 12/15/19   Shelva Majestic, MD  Multiple Vitamin (MULTIVITAMIN) capsule Take 1 capsule by mouth daily.      [provider]  tamsulosin (FLOMAX) 0.4 MG CAPS capsule Take 1 capsule (0.4 mg total) by mouth daily. 12/15/19   Shelva Majestic, MD  traMADol (ULTRAM) 50 MG tablet Take 1 tablet (50 mg total) by mouth every 4 (four) hours. Possible rectal cancer 12/15/19   Shelva Majestic, MD    Allergies    Penicillins  Review of Systems   Review  of Systems  Unable to perform ROS: Mental status change  Constitutional: Negative for chills and fever.  HENT: Negative for congestion and facial swelling.   Eyes: Negative for discharge and visual disturbance.  Respiratory: Negative for shortness of breath.   Cardiovascular: Negative for chest pain and palpitations.  Gastrointestinal: Negative for abdominal pain, diarrhea and vomiting.  Musculoskeletal: Negative for arthralgias and myalgias.  Skin: Negative for color change and rash.  Neurological: Negative for tremors, syncope and headaches.  Psychiatric/Behavioral: Negative for confusion and dysphoric mood.    Physical Exam Updated Vital Signs BP (!) 90/40   Pulse 85   Temp 97.9 F (36.6 C) (Oral)   Resp 11   Ht 5\' 4"  (1.626 m)   Wt 81.6 kg   SpO2 100%   BMI 30.88 kg/m   Physical Exam Vitals and nursing note reviewed.  Constitutional:      Appearance: He is well-developed and well-nourished.  HENT:     Head: Normocephalic and atraumatic.  Eyes:     Extraocular Movements: EOM normal.     Pupils: Pupils are equal, round, and reactive to light.  Neck:     Vascular: No JVD.  Cardiovascular:     Rate and Rhythm: Normal rate and regular rhythm.     Heart sounds: No murmur heard. No friction rub. No gallop.   Pulmonary:     Effort: No respiratory distress.     Breath sounds: No wheezing.  Abdominal:     General: There is no distension.     Tenderness: There is no guarding or rebound.  Genitourinary:   Musculoskeletal:        General: Normal range of motion.     Cervical back: Normal range of motion and neck supple.  Skin:    Coloration: Skin is not pale.     Findings: No rash.  Neurological:     Mental Status: He is alert.  Psychiatric:        Mood and Affect: Mood and affect normal.        Behavior: Behavior is agitated.     ED Results / Procedures / Treatments   Labs (all labs ordered are listed, but only abnormal results are displayed) Labs Reviewed   CBC WITH DIFFERENTIAL/PLATELET - Abnormal; Notable for the following components:      Result Value   WBC 13.9 (*)    RBC 2.75 (*)    Hemoglobin 6.9 (*)    HCT 24.5 (*)    MCH 25.1 (*)    MCHC 28.2 (*)    RDW 18.7 (*)    Platelets 509 (*)  Neutro Abs 11.6 (*)    Monocytes Absolute 1.3 (*)    Abs Immature Granulocytes 0.11 (*)    All other components within normal limits  COMPREHENSIVE METABOLIC PANEL - Abnormal; Notable for the following components:   Sodium 134 (*)    Potassium 5.3 (*)    Chloride 94 (*)    Glucose, Bld 152 (*)    BUN 29 (*)    Creatinine, Ser 1.88 (*)    Calcium 8.4 (*)    Total Protein 6.2 (*)    Albumin 1.8 (*)    Alkaline Phosphatase 324 (*)    GFR, Estimated 35 (*)    All other components within normal limits  SARS CORONAVIRUS 2 (TAT 6-24 HRS)  URINALYSIS, ROUTINE W REFLEX MICROSCOPIC  TYPE AND SCREEN  ABO/RH  PREPARE RBC (CROSSMATCH)  TROPONIN I (HIGH SENSITIVITY)  TROPONIN I (HIGH SENSITIVITY)    EKG EKG Interpretation  Date/Time:  Tuesday January 11 2020 15:56:47 EST Ventricular Rate:  91 PR Interval:  158 QRS Duration: 80 QT Interval:  336 QTC Calculation: 413 R Axis:   64 Text Interpretation: Normal sinus rhythm Normal ECG No significant change since last tracing Confirmed by Melene Plan [phone]) on 01/11/2020 6:10:20 PM   Radiology CT Head Wo Contrast  Result Date: 01/11/2020 CLINICAL DATA:  Delirium. EXAM: CT HEAD WITHOUT CONTRAST TECHNIQUE: Contiguous axial images were obtained from the base of the skull through the vertex without intravenous contrast. COMPARISON:  Head CT 11/07/2013. FINDINGS: Brain: Mild cerebral and cerebellar atrophy. Mild ill-defined hypoattenuation within the cerebral white matter is nonspecific, but compatible with chronic small vessel ischemic disease. There is no acute intracranial hemorrhage. No demarcated cortical infarct. No extra-axial fluid collection. No evidence of intracranial mass. No midline  shift. Vascular: No hyperdense vessel.  Atherosclerotic calcifications. Skull: Normal. Negative for fracture or focal lesion. Sinuses/Orbits: Visualized orbits show no acute finding. Mild scattered ethmoid sinus mucosal thickening. 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 IMPRESSION: No evidence of acute intracranial abnormality. Mild generalized atrophy of the brain, progressed from the head CT of 11/07/2013. Stable mild cerebral white matter chronic small vessel ischemic disease. Mild ethmoid sinus mucosal thickening. 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 Electronically Signed   By: Jackey Loge DO   On: 01/11/2020 19:19    Procedures Procedures (including critical care time)  Medications Ordered in ED Medications  morphine 2 MG/ML injection 2 mg (0 mg Intravenous Hold 01/11/20 2111)  ondansetron (ZOFRAN) injection 4 mg (0 mg Intravenous Hold 01/11/20 2111)  morphine 2 MG/ML injection 2 mg (0 mg Intramuscular Hold 01/11/20 2203)  LORazepam (ATIVAN) tablet 1 mg (has no administration in time range)    Or  LORazepam (ATIVAN) 2 MG/ML concentrated solution 1 mg (has no administration in time range)    Or  LORazepam (ATIVAN) injection 1 mg (has no administration in time range)  ondansetron (ZOFRAN-ODT) disintegrating tablet 4 mg (has no administration in time range)    Or  ondansetron (ZOFRAN) injection 4 mg (has no administration in time range)  alum & mag hydroxide-simeth (MAALOX/MYLANTA) 200-200-20 MG/5ML suspension 30 mL (has no administration in time range)  LORazepam (ATIVAN) injection 1 mg (has no administration in time range)  OLANZapine zydis (ZYPREXA) disintegrating tablet 5 mg (5 mg Oral Given 01/11/20 2208)  morphine CONCENTRATE 10 MG/0.5ML oral solution 5 mg (has no administration in time range)    Or  morphine CONCENTRATE  10 MG/0.5ML oral solution 5 mg (has no administration in time range)  0.9 %   sodium chloride infusion (Manually program via Guardrails IV Fluids) (has no administration in time range)  sodium chloride 0.9 % bolus 500 mL (has no administration in time range)    ED Course  I have reviewed the triage vital signs and the nursing notes.  Pertinent labs & imaging results that were available during my care of the patient were reviewed by me and considered in my medical decision making (see chart for details).    MDM Rules/Calculators/A&P                          86 yo M with a chief complaints of worsening pain and failure to thrive.  This been going on at home as the patient had been diagnosed with likely cancer and had declined any therapy and requested to go home to die.  He had called his family doctor's office today because he had had some low blood pressures when it was checked by the home health nurse.  The family doctor's office reportedly told him to come to the emergency department and his doctor would see him while he was in the hospital.  The patient was unable to provide any of this history for me.  I received this history from the patient's son daughter and his spouse.  They would like for him to come into the hospital overnight so he can be evaluated by his family doctor.  I discussed with him that he was found to be anemic today which I likely suspect is from his rectal mass.  They agree that the patient would most likely want to be made comfortable and so I have placed some comfort care orders.  Family was requesting a blood transfusion for his acute anemia.  I did put in a palliative care consult as well.  CRITICAL CARE Performed by: Cecilio Asper   Total critical care time: 80 minutes  Critical care time was exclusive of separately billable procedures and treating other patients.  Critical care was necessary to treat or prevent imminent or life-threatening deterioration.  Critical care was time spent personally by me on the following activities:  development of treatment plan with patient and/or surrogate as well as nursing, discussions with consultants, evaluation of patient's response to treatment, examination of patient, obtaining history from patient or surrogate, ordering and performing treatments and interventions, ordering and review of laboratory studies, ordering and review of radiographic studies, pulse oximetry and re-evaluation of patient's condition.  The patients results and plan were reviewed and discussed.   Any x-rays performed were independently reviewed by myself.   Differential diagnosis were considered with the presenting HPI.  Medications  morphine 2 MG/ML injection 2 mg (0 mg Intravenous Hold 01/11/20 2111)  ondansetron [facility]) injection 4 mg (0 mg Intravenous Hold 01/11/20 2111)  morphine 2 MG/ML injection 2 mg (0 mg Intramuscular Hold 01/11/20 2203)  LORazepam (ATIVAN) tablet 1 mg (has no administration in time range)    Or  LORazepam (ATIVAN) 2 MG/ML concentrated solution 1 mg (has no administration in time range)    Or  LORazepam (ATIVAN) injection 1 mg (has no administration in time range)  ondansetron (ZOFRAN-ODT) disintegrating tablet 4 mg (has no administration in time range)    Or  ondansetron (ZOFRAN) injection 4 mg (has no administration in time range)  alum & mag hydroxide-simeth (MAALOX/MYLANTA) 200-200-20 MG/5ML suspension 30 mL (  has no administration in time range)  LORazepam (ATIVAN) injection 1 mg (has no administration in time range)  OLANZapine zydis (ZYPREXA) disintegrating tablet 5 mg (5 mg Oral Given 01/11/20 2208)  morphine CONCENTRATE 10 MG/0.5ML oral solution 5 mg (has no administration in time range)    Or  morphine CONCENTRATE 10 MG/0.5ML oral solution 5 mg (has no administration in time range)  0.9 %  sodium chloride infusion (Manually program via Guardrails IV Fluids) (has no administration in time range)  sodium chloride 0.9 % bolus 500 mL (has no administration in time range)     Vitals:   01/11/20 2130 01/11/20 2215 01/11/20 2230 01/11/20 2233  BP: (!) 121/49 (!) 104/38  (!) 90/40  Pulse: 88   85  Resp: 17 11  11   Temp:   97.8 F (36.6 C) 97.9 F (36.6 C)  TempSrc:    Oral  SpO2: 99% 99%  100%  Weight:      Height:        Final diagnoses:  Acute anemia  Failure to thrive in adult  Rectal mass    Admission/ observation were discussed with the admitting physician, patient and/or family and they are comfortable with the plan.    Final Clinical Impression(s) / ED Diagnoses Final diagnoses:  Acute anemia  Failure to thrive in adult  Rectal mass    Rx / DC Orders ED Discharge Orders    None       Deno Etienne, DO 01/11/20 2254

## 2020-01-11 NOTE — ED Triage Notes (Signed)
Pt BIB [facility] for possible UTI and all over body pain. Pt requested to come here, states his dr's are gong to meet him here.   BP 101/53 HR 90 97% RA RR 11 Co2 41 98.6 oral CBG 173

## 2020-01-11 NOTE — Telephone Encounter (Signed)
error 

## 2020-01-11 NOTE — ED Notes (Signed)
Hgb 6.9  

## 2020-01-11 NOTE — Telephone Encounter (Signed)
Fyi.

## 2020-01-11 NOTE — Telephone Encounter (Signed)
Home health called letting us know that pt.'s bp was 109/34, HR 93, O2 91%. Attempted to get patient triaged on the nurse line. Nurse transferred home health back to our office, because apparently they cannot help them since she is home health. Dr Durene Cal is not in office, sought guidance from [place]. She advised ED. Relayed this message to home health worker.

## 2020-01-11 NOTE — Telephone Encounter (Signed)
Patient also missed PT the week before.

## 2020-01-11 NOTE — Telephone Encounter (Signed)
[facility] calling to let Dr. Durene Cal know that pt is missing PT this week. Wife says pt isn't feeling up to is today.

## 2020-01-12 DIAGNOSIS — Z823 Family history of stroke: Secondary | ICD-10-CM | POA: Diagnosis not present

## 2020-01-12 DIAGNOSIS — Z87891 Personal history of nicotine dependence: Secondary | ICD-10-CM | POA: Diagnosis not present

## 2020-01-12 DIAGNOSIS — N1832 Chronic kidney disease, stage 3b: Secondary | ICD-10-CM | POA: Diagnosis present

## 2020-01-12 DIAGNOSIS — D630 Anemia in neoplastic disease: Secondary | ICD-10-CM | POA: Diagnosis present

## 2020-01-12 DIAGNOSIS — R52 Pain, unspecified: Secondary | ICD-10-CM | POA: Diagnosis not present

## 2020-01-12 DIAGNOSIS — R451 Restlessness and agitation: Secondary | ICD-10-CM | POA: Diagnosis present

## 2020-01-12 DIAGNOSIS — I959 Hypotension, unspecified: Secondary | ICD-10-CM | POA: Diagnosis present

## 2020-01-12 DIAGNOSIS — R4182 Altered mental status, unspecified: Secondary | ICD-10-CM | POA: Diagnosis not present

## 2020-01-12 DIAGNOSIS — E861 Hypovolemia: Secondary | ICD-10-CM | POA: Diagnosis present

## 2020-01-12 DIAGNOSIS — R627 Adult failure to thrive: Secondary | ICD-10-CM | POA: Diagnosis not present

## 2020-01-12 DIAGNOSIS — Z87440 Personal history of urinary (tract) infections: Secondary | ICD-10-CM | POA: Diagnosis not present

## 2020-01-12 DIAGNOSIS — Z515 Encounter for palliative care: Principal | ICD-10-CM

## 2020-01-12 DIAGNOSIS — C20 Malignant neoplasm of rectum: Secondary | ICD-10-CM

## 2020-01-12 DIAGNOSIS — D75839 Thrombocytosis, unspecified: Secondary | ICD-10-CM | POA: Diagnosis present

## 2020-01-12 DIAGNOSIS — Z6830 Body mass index (BMI) 30.0-30.9, adult: Secondary | ICD-10-CM | POA: Diagnosis not present

## 2020-01-12 DIAGNOSIS — I130 Hypertensive heart and chronic kidney disease with heart failure and stage 1 through stage 4 chronic kidney disease, or unspecified chronic kidney disease: Secondary | ICD-10-CM | POA: Diagnosis present

## 2020-01-12 DIAGNOSIS — Z66 Do not resuscitate: Secondary | ICD-10-CM | POA: Diagnosis present

## 2020-01-12 DIAGNOSIS — Z7189 Other specified counseling: Secondary | ICD-10-CM

## 2020-01-12 DIAGNOSIS — K6289 Other specified diseases of anus and rectum: Secondary | ICD-10-CM | POA: Diagnosis not present

## 2020-01-12 DIAGNOSIS — C785 Secondary malignant neoplasm of large intestine and rectum: Secondary | ICD-10-CM | POA: Diagnosis present

## 2020-01-12 DIAGNOSIS — Z880 Allergy status to penicillin: Secondary | ICD-10-CM | POA: Diagnosis not present

## 2020-01-12 DIAGNOSIS — Z825 Family history of asthma and other chronic lower respiratory diseases: Secondary | ICD-10-CM | POA: Diagnosis not present

## 2020-01-12 DIAGNOSIS — D649 Anemia, unspecified: Secondary | ICD-10-CM

## 2020-01-12 DIAGNOSIS — M2550 Pain in unspecified joint: Secondary | ICD-10-CM | POA: Diagnosis not present

## 2020-01-12 DIAGNOSIS — Z79899 Other long term (current) drug therapy: Secondary | ICD-10-CM | POA: Diagnosis not present

## 2020-01-12 DIAGNOSIS — R410 Disorientation, unspecified: Secondary | ICD-10-CM | POA: Diagnosis present

## 2020-01-12 DIAGNOSIS — Z20822 Contact with and (suspected) exposure to covid-19: Secondary | ICD-10-CM | POA: Diagnosis present

## 2020-01-12 DIAGNOSIS — Z7401 Bed confinement status: Secondary | ICD-10-CM | POA: Diagnosis not present

## 2020-01-12 DIAGNOSIS — E785 Hyperlipidemia, unspecified: Secondary | ICD-10-CM | POA: Diagnosis present

## 2020-01-12 DIAGNOSIS — I5032 Chronic diastolic (congestive) heart failure: Secondary | ICD-10-CM | POA: Diagnosis present

## 2020-01-12 LAB — BPAM RBC
Blood Product Expiration Date: 202201212359
ISSUE DATE / TIME: 202112282242
Unit Type and Rh: 6200

## 2020-01-12 LAB — TYPE AND SCREEN
ABO/RH(D): A POS
Antibody Screen: NEGATIVE
Unit division: 0

## 2020-01-12 LAB — HEMOGLOBIN AND HEMATOCRIT, BLOOD
HCT: 27.4 % — ABNORMAL LOW (ref 39.0–52.0)
Hemoglobin: 8.1 g/dL — ABNORMAL LOW (ref 13.0–17.0)

## 2020-01-12 LAB — SARS CORONAVIRUS 2 (TAT 6-24 HRS): SARS Coronavirus 2: NEGATIVE

## 2020-01-12 MED ORDER — MORPHINE SULFATE (PF) 2 MG/ML IV SOLN
2.0000 mg | INTRAVENOUS | Status: DC | PRN
  Administered 2020-01-14 (×3): 2 mg via INTRAVENOUS
  Filled 2020-01-12 (×4): qty 1

## 2020-01-12 MED ORDER — BOOST / RESOURCE BREEZE PO LIQD CUSTOM
1.0000 | Freq: Two times a day (BID) | ORAL | Status: DC
  Administered 2020-01-12 – 2020-01-14 (×4): 1 via ORAL

## 2020-01-12 NOTE — ED Notes (Signed)
Lunch Tray Ordered @ 1038.  

## 2020-01-12 NOTE — Consult Note (Signed)
Consultation Note Date: 01/12/2020   Patient Name: John Hutchinson.  DOB: [birth year]  MRN: [id number]  Age / Sex: 86 y.o., male  PCP: Marin Olp, MD Referring Physician: Elmarie Shiley, MD  Reason for Consultation: Establishing goals of care  HPI/Patient Profile: 86 y.o. male  with past medical history of HTN, CHF, CKD, and recently diagnosed with metastatic rectal cancer admitted on 01/11/2020 with worsening pain and fatigue. Patient has been increasingly fatigued and generally weak over the course of several months, has not walked in on his own in a few months, and has been refusing to participate in physical therapy.  He had a large rectal mass noted on physical exam recently and outpatient CT was concerning for metastatic rectal cancer.  Patient has indicated that he is not interested in any treatment for this.  Patient's family supports his decision to focus on comfort only. PMT consulted to assist with end of life planning.   Clinical Assessment and Goals of Care: I have reviewed medical records including EPIC notes, labs and imaging, received report from nurse, assessed the patient and then spoke with patients spouse and son  to discuss diagnosis prognosis, GOC, EOL wishes, disposition and options.  I attempted to speak to patient however he was not responsive to voice or touch. Ativan given a few hours ago.   I introduced Palliative Medicine as specialized medical care for people living with serious illness. It focuses on providing relief from the symptoms and stress of a serious illness. The goal is to improve quality of life for both the patient and the family.  As far as functional and nutritional status, they tell me patient is mostly bed bound. He occasionally stands up but at most walks 2-3 ft and this is not typical. His appetite has been very poor. Mostly only drinks - will drink about 1 Boost a day. Spends most of his time  asleep.  We discussed patient's current illness and what it means in the larger context of patient's on-going co-morbidities.  Natural disease trajectory and expectations at EOL were discussed.   I attempted to elicit values and goals of care important to the patient.  They both understand and support patient's desire to avoid aggressive interventions and focus on comfort.   Hospice services outpatient were explained and offered. I discuss hospice facility vs hospice care at home. Family immediately shares they are not interested in hospice facility. I describe hospice support at home. Wife and son are unsure. I describe that hospice philosophy of care is in line with patient's previously stated wishes. They are unsure that patient would want hospice care providers in his home and tell me about his home health care. We discuss that hospice would take the place of his home health and would help support the symptom management the patient needs. They both want the patient to make this decisions, however, he is currently unable to speak to me. They tell me "he always comes out of it, give him a couple of days". I agree that I will go back to talk to patient tomorrow in hopes he is more alert to make decision about how to move forward with care.   Son asks why patient is not receiving his regular medications he took at home. We discuss that he had requested full comfort measures so we avoid medications that are not necessary to ensure his comfort. We also discuss that patient is not able to take PO medications currently d/t his  mental status.   Questions and concerns were addressed. The family was encouraged to call with questions or concerns.   Primary Decision Maker PATIENT? Currently unable, so wife is next of kin Unsure if current mental status is only d/t medication  SUMMARY OF RECOMMENDATIONS   Comfort care, appropriate for hospice though family unsure - ask that I try to speak to patient again  tomorrow  Code Status/Advance Care Planning:  DNR   Symptom Management:   Continue current measures  Discharge Planning: Home with Hospice ?     Primary Diagnoses: Present on Admission: . Symptomatic anemia . Rectal cancer ([place]) . Chronic kidney disease, stage 3b ([place]) . (HFpEF) heart failure with preserved ejection fraction ([place])   I have reviewed the medical record, interviewed the patient and family, and examined the patient. The following aspects are pertinent.  Past Medical History:  Diagnosis Date  . Arthritis   . Gallstones   . HYPERLIPIDEMIA 08/26/2006  . HYPERTENSION 08/26/2006  . PROSTATE SPECIFIC ANTIGEN, ELEVATED 06/24/2006   Social History   Socioeconomic History  . Marital status: Married    Spouse name: [name]  . Number of children: 6  . Years of education: 70  . Highest education level: Not on file  Occupational History  . Occupation: retired  Tobacco Use  . Smoking status: Former Smoker    Packs/day: 1.00    Years: 3.00    Pack years: 3.00    Types: Cigarettes    Quit date: 01/15/1955    Years since quitting: 65.0  . Smokeless tobacco: Never Used  Vaping Use  . Vaping Use: Never used  Substance and Sexual Activity  . Alcohol use: No    Alcohol/week: 0.0 standard drinks  . Drug use: No  . Sexual activity: Yes  Other Topics Concern  . Not on file  Social History Narrative   Married (wife patient of Dr. Yong Channel). 6 children. 3 grandkids.       Retired from Scientist, research (life sciences) orderly      Hobbies: watch movies   Social Determinants of Radio broadcast assistant Strain: Not on file  Food Insecurity: Not on file  Transportation Needs: Not on file  Physical Activity: Not on file  Stress: Not on file  Social Connections: Not on file   Family History  Problem Relation Age of Onset  . Stroke Mother        51s, smoker  . COPD Father   . Emphysema Father    Scheduled Meds: . feeding supplement  1 Container Oral  BID BM  .  morphine injection  2 mg Intravenous Once  . OLANZapine zydis  5 mg Oral Daily  . ondansetron (ZOFRAN) IV  4 mg Intravenous Once   Continuous Infusions: PRN Meds:.alum & mag hydroxide-simeth, LORazepam **OR** LORazepam **OR** LORazepam, LORazepam, morphine injection, morphine CONCENTRATE **OR** morphine CONCENTRATE, ondansetron **OR** ondansetron (ZOFRAN) IV Allergies  Allergen Reactions  . Penicillins Other (See Comments)    unknown   Review of Systems  Unable to perform ROS: Mental status change    Physical Exam Constitutional:      Comments: Frail, thin, unresponsive to voice or touch  Cardiovascular:     Rate and Rhythm: Normal rate and regular rhythm.  Pulmonary:     Comments: irregular Musculoskeletal:     Right lower leg: No edema.     Left lower leg: No edema.  Skin:    General: Skin is warm and dry.  Vital Signs: BP (!) 114/56 (BP Location: Right Arm)   Pulse 79   Temp 97.6 F (36.4 C)   Resp 16   Ht 5\' 4"  (1.626 m)   Wt 81.6 kg   SpO2 100%   BMI 30.88 kg/m  Pain Scale: 0-10   Pain Score: Asleep (rise and fall of chest noted)   SpO2: SpO2: 100 % O2 Device:SpO2: 100 % O2 Flow Rate: .   IO: Intake/output summary: No intake or output data in the 24 hours ending 01/12/20 1456  LBM:   Baseline Weight: Weight: 81.6 kg Most recent weight: Weight: 81.6 kg     Palliative Assessment/Data: PPS 10% during my evaluation    Time Total: 65 minutes Greater than 50%  of this time was spent counseling and coordinating care related to the above assessment and plan.  01/14/20, DNP, AGNP-C Palliative Medicine Team [phone] Pager: [phone]

## 2020-01-12 NOTE — Telephone Encounter (Signed)
I do not see ER visit- lets follow up with patient today. I am out of office until next week- thanks everyone for caring for him

## 2020-01-12 NOTE — Plan of Care (Signed)

## 2020-01-12 NOTE — ED Notes (Signed)
Pt got out of bed and was sitting on chair, pt then began crawling on the ground and calling out to call his pastor.

## 2020-01-12 NOTE — Progress Notes (Signed)
PROGRESS NOTE    John Hutchinson.  [id number] DOB: 08/13/1933 DOA: 01/11/2020 PCP: Marin Olp, MD   Brief Narrative: 86 year old with past medical history significant for hypertension, chronic diastolic heart failure, chronic kidney disease recently diagnosed with metastatic rectal cancer, now presenting to the emergency department with worsening pain and fatigue.  Patient spouse and son were contacted by phone by admitting provider.  Patient has been increasingly fatigued and generally weak over the course of several months.  Has not walk on his own in few months and has been refusing to participate in physical therapy.  He had a large rectal mass noted on physical exam recently an outpatient CT was concerning for metastatic rectal cancer.  Patient has indicated that he is not interested in any treatment for this.  Patient family support his decision to focus on comfort only.  Palliative care has been consulted.  He was found to have a hemoglobin 6.9.  Family was okay with 1 unit of packed red blood cell transfusion.    Assessment & Plan:   Principal Problem:   Symptomatic anemia Active Problems:   (HFpEF) heart failure with preserved ejection fraction (HCC)   Rectal cancer (HCC)   Chronic kidney disease, stage 3b (HCC)   1-Symptomatic  anemia: Patient presented with weakness, likely related to anemia and metastatic cancer. Patient received 1 unit of packed red blood cell Repeated Hemoglobin at 8.1  2-Metastatic rectal cancer; Recently diagnosed as an outpatient. Patient and family  Are  not interested in treatment Palliative care consulted. Pain management.  Patient appears to be in pain IV morphine PRN for pain.   3-CKD stage IIIb: Creatinine baseline 1.7  4-Hypertension: Patient with low blood pressure on admission.  Hold antihypertensive 5-Chronic diastolic heart failure: appears hypovolemic Hold diuretics   Estimated body mass index is 30.88 kg/m as  calculated from the following:   Height as of this encounter: 5\' 4"  (1.626 m).   Weight as of this encounter: 81.6 kg.   DVT prophylaxis: Comfort Code Status: DNR Family Communication: Disposition Plan:  Status is: Observation  The patient will require care spanning > 2 midnights and should be moved to inpatient because: IV treatments appropriate due to intensity of illness or inability to take PO  Dispo: The patient is from: Home              Anticipated d/c is to: to be determine              Anticipated d/c date is: 2 days              Patient currently is not medically stable to d/c. requiring IV morphine for pain managent        Consultants:   Palliative care  Procedures:   none  Antimicrobials:  none  Subjective: Lying in bed, appears in pain.   Objective: Vitals:   01/12/20 0545 01/12/20 0700 01/12/20 0705 01/12/20 0707  BP: (!) 116/54  (!) 132/57   Pulse: 79 82  84  Resp: 11 15 (!) 22 17  Temp:      TempSrc:      SpO2: 100% 100%  100%  Weight:      Height:       No intake or output data in the 24 hours ending 01/12/20 0809 Filed Weights   01/11/20 1546  Weight: 81.6 kg    Examination:  General exam: Chronic ill appearing Respiratory system: tachypnea.  Cardiovascular system: S1 & S2 heard,  RRR. No JVD, murmurs, rubs, gallops or clicks. No pedal edema. Gastrointestinal system: Abdomen is nondistended, soft and nontender. No organomegaly or masses felt. Normal bowel sounds heard. Central nervous system: Alert Extremities: LE with thick skin and hyperpigmentation.    Data Reviewed: I have personally reviewed following labs and imaging studies  CBC: Recent Labs  Lab 01/11/20 1549 01/12/20 0403  WBC 13.9*  --   NEUTROABS 11.6*  --   HGB 6.9* 8.1*  HCT 24.5* 27.4*  MCV 89.1  --   PLT 509*  --    Basic Metabolic Panel: Recent Labs  Lab 01/11/20 1549  NA 134*  K 5.3*  CL 94*  CO2 25  GLUCOSE 152*  BUN 29*  CREATININE 1.88*   CALCIUM 8.4*   GFR: Estimated Creatinine Clearance: 28.2 mL/min (A) (by C-G formula based on SCr of 1.88 mg/dL (H)). Liver Function Tests: Recent Labs  Lab 01/11/20 1549  AST 35  ALT 12  ALKPHOS 324*  BILITOT 0.6  PROT 6.2*  ALBUMIN 1.8*   No results for input(s): LIPASE, AMYLASE in the last 168 hours. No results for input(s): AMMONIA in the last 168 hours. Coagulation Profile: No results for input(s): INR, PROTIME in the last 168 hours. Cardiac Enzymes: No results for input(s): CKTOTAL, CKMB, CKMBINDEX, TROPONINI in the last 168 hours. BNP (last 3 results) No results for input(s): PROBNP in the last 8760 hours. HbA1C: No results for input(s): HGBA1C in the last 72 hours. CBG: No results for input(s): GLUCAP in the last 168 hours. Lipid Profile: No results for input(s): CHOL, HDL, LDLCALC, TRIG, CHOLHDL, LDLDIRECT in the last 72 hours. Thyroid Function Tests: No results for input(s): TSH, T4TOTAL, FREET4, T3FREE, THYROIDAB in the last 72 hours. Anemia Panel: No results for input(s): VITAMINB12, FOLATE, FERRITIN, TIBC, IRON, RETICCTPCT in the last 72 hours. Sepsis Labs: No results for input(s): PROCALCITON, LATICACIDVEN in the last 168 hours.  Recent Results (from the past 240 hour(s))  SARS CORONAVIRUS 2 (TAT 6-24 HRS) Nasopharyngeal Nasopharyngeal Swab     Status: None   Collection Time: 01/11/20 11:33 PM   Specimen: Nasopharyngeal Swab  Result Value Ref Range Status   SARS Coronavirus 2 NEGATIVE NEGATIVE Final    Comment: (NOTE) SARS-CoV-2 target nucleic acids are NOT DETECTED.  The SARS-CoV-2 RNA is generally detectable in upper and lower respiratory specimens during the acute phase of infection. Negative results do not preclude SARS-CoV-2 infection, do not rule out co-infections with other pathogens, and should not be used as the sole basis for treatment or other patient management decisions. Negative results must be combined with clinical  observations, patient history, and epidemiological information. The expected result is Negative.  Fact Sheet for Patients: SugarRoll.be  Fact Sheet for Healthcare Providers: https://www.woods-mathews.com/  This test is not yet approved or cleared by the [place] FDA and  has been authorized for detection and/or diagnosis of SARS-CoV-2 by FDA under an Emergency Use Authorization (EUA). This EUA will remain  in effect (meaning this test can be used) for the duration of the COVID-19 declaration under Se ction 564(b)(1) of the Act, 21 U.S.C. section 360bbb-3(b)(1), unless the authorization is terminated or revoked sooner.  Performed at [facility], [place] [address] [place], [place] [address]          Radiology Studies: CT Head Wo Contrast  Result Date: 01/11/2020 CLINICAL DATA:  Delirium. EXAM: CT HEAD WITHOUT CONTRAST TECHNIQUE: Contiguous axial images were obtained from the base of the skull through the  vertex without intravenous contrast. COMPARISON:  Head CT 11/07/2013. FINDINGS: Brain: Mild cerebral and cerebellar atrophy. Mild ill-defined hypoattenuation within the cerebral white matter is nonspecific, but compatible with chronic small vessel ischemic disease. There is no acute intracranial hemorrhage. No demarcated cortical infarct. No extra-axial fluid collection. No evidence of intracranial mass. No midline shift. Vascular: No hyperdense vessel.  Atherosclerotic calcifications. Skull: Normal. Negative for fracture or focal lesion. Sinuses/Orbits: Visualized orbits show no acute finding. Mild scattered ethmoid sinus mucosal thickening. 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 IMPRESSION: No evidence of acute intracranial abnormality. Mild generalized atrophy of the brain, progressed from the head CT of 11/07/2013. Stable mild cerebral white matter chronic small vessel ischemic disease.  Mild ethmoid sinus mucosal thickening. 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 Electronically Signed   By: Jackey Loge DO   On: 01/11/2020 19:19        Scheduled Meds: .  morphine injection  2 mg Intravenous Once  . OLANZapine zydis  5 mg Oral Daily  . ondansetron (ZOFRAN) IV  4 mg Intravenous Once   Continuous Infusions:   LOS: 0 days    Time spent: 35 minutes.    Alba Cory, MD [facility]   If 7PM-7AM, please contact night-coverage [email or web address]  01/12/2020, 8:09 AM

## 2020-01-13 DIAGNOSIS — R627 Adult failure to thrive: Secondary | ICD-10-CM | POA: Diagnosis not present

## 2020-01-13 DIAGNOSIS — Z515 Encounter for palliative care: Secondary | ICD-10-CM | POA: Diagnosis not present

## 2020-01-13 DIAGNOSIS — Z7189 Other specified counseling: Secondary | ICD-10-CM | POA: Diagnosis not present

## 2020-01-13 DIAGNOSIS — D649 Anemia, unspecified: Secondary | ICD-10-CM | POA: Diagnosis not present

## 2020-01-13 DIAGNOSIS — Z66 Do not resuscitate: Secondary | ICD-10-CM

## 2020-01-13 DIAGNOSIS — C20 Malignant neoplasm of rectum: Secondary | ICD-10-CM | POA: Diagnosis not present

## 2020-01-13 NOTE — TOC Initial Note (Addendum)
Transition of Care (TOC) - Initial/Assessment Note    Patient Details  Name: John Hutchinson. MRN: [id number] Date of Birth: 09/03/1933  Transition of Care [facility]) CM/SW Contact:    Lorri Frederick, LCSW Phone Number: 01/13/2020, 2:29 PM  Clinical Narrative:  Pt not able to participate in assessment, CSW spoke with wife over the phone.  They just started receiving HH services through [facility] this week.  Discussed change to comfort care and hospice, wife would like to remain with [facility] hospice.  Choice document left in pt room.  PCP in place.  Equipment in home: walker, wheelchair, cane.    CSW spoke with Merdis Delay at [facility] and then received call back from [place], [phone], also with [facility].  He will contact family and assess equipment needs and call back  Text from Tim/[facility].  They are all set with equipment.                   Expected Discharge Plan: Home w Hospice Care Barriers to Discharge: Continued Medical Work up   Patient Goals and CMS Choice Patient states their goals for this hospitalization and ongoing recovery are:: comfort per palliative CMS Medicare.gov Compare Post Acute Care list provided to:: Patient Represenative (must comment) Choice offered to / list presented to : Spouse  Expected Discharge Plan and Services Expected Discharge Plan: Home w Hospice Care     Post Acute Care Choice: Hospice Living arrangements for the past 2 months: Single Family Home                                      Prior Living Arrangements/Services Living arrangements for the past 2 months: Single Family Home Lives with:: Spouse Patient language and need for interpreter reviewed:: No        Need for Family Participation in Patient Care: Yes (Comment) Care giver support system in place?: Yes (comment) Current home services: Home PT,Home OT (HH [facility] just started this week) Criminal Activity/Legal Involvement Pertinent to Current  Situation/Hospitalization: No - Comment as needed  Activities of Daily Living      Permission Sought/Granted                  Emotional Assessment Appearance:: Appears stated age Attitude/Demeanor/Rapport: Unable to Assess Affect (typically observed): Unable to Assess Orientation: : Oriented to Self Alcohol / Substance Use: Not Applicable Psych Involvement: No (comment)  Admission diagnosis:  Rectal mass [K62.89] Failure to thrive in adult [R62.7] Symptomatic anemia [D64.9] Acute anemia [D64.9] Patient Active Problem List   Diagnosis Date Noted  . Symptomatic anemia 01/11/2020  . Rectal cancer (HCC) 01/11/2020  . Chronic kidney disease, stage 3b (HCC) 01/11/2020  . Aortic atherosclerosis (HCC) 12/16/2019  . Anemia 05/16/2017  . Weight loss 05/16/2017  . Goals of care, counseling/discussion 09/29/2015  . BPH (benign prostatic hyperplasia) 03/09/2014  . (HFpEF) heart failure with preserved ejection fraction (HCC) 03/09/2014  . CKD (chronic kidney disease), stage II 02/17/2014  . Degenerative arthritis 09/28/2012  . Hyperlipidemia 08/26/2006  . Essential hypertension 08/26/2006  . PROSTATE SPECIFIC ANTIGEN, ELEVATED 06/24/2006   PCP:  Shelva Majestic, MD Pharmacy:   [facility] [address]), [place] - [address] [place]) [place] [address] Phone: [phone] Fax: [phone]     Social Determinants of Health (SDOH) Interventions    Readmission Risk Interventions No flowsheet data  found.  

## 2020-01-13 NOTE — Progress Notes (Addendum)
PROGRESS NOTE    John Hutchinson.  [id number] DOB: [birth year] DOA: 01/11/2020 PCP: Marin Olp, MD   Brief Narrative: 86 year old with past medical history significant for hypertension, chronic diastolic heart failure, chronic kidney disease recently diagnosed with metastatic rectal cancer, now presenting to the emergency department with worsening pain and fatigue.  Patient spouse and son were contacted by phone by admitting provider.  Patient has been increasingly fatigued and generally weak over the course of several months.  Has not walk on his own in few months and has been refusing to participate in physical therapy.  He had a large rectal mass noted on physical exam recently an outpatient CT was concerning for metastatic rectal cancer.  Patient has indicated that he is not interested in any treatment for this.  Patient family support his decision to focus on comfort only.  Palliative care has been consulted.  He was found to have a hemoglobin 6.9.  Family was okay with 1 unit of packed red blood cell transfusion.    Assessment & Plan:   Principal Problem:   Symptomatic anemia Active Problems:   (HFpEF) heart failure with preserved ejection fraction (HCC)   Rectal cancer (HCC)   Chronic kidney disease, stage 3b (HCC)   1-Symptomatic  Anemia: secondary to neoplasia.  Patient presented with weakness, likely related to anemia and metastatic cancer. Patient received 1 unit of packed red blood cell Repeated Hemoglobin at 8.1  2-Metastatic rectal cancer; Recently diagnosed as an outpatient. Patient and family  Are  not interested in treatment Palliative care consulted. Patient and family decide to proceed with comfort care, home with hospice at discharge.  IV morphine PRN for pain.  Will provide pain medication at discharge.   3-CKD stage IIIb: Creatinine baseline 1.7 Comfort care no further lab drawn.   4-Hypertension: Patient with low blood pressure on admission.   Hold antihypertensive 5-Chronic diastolic heart failure: appears hypovolemic Hold diuretics   Estimated body mass index is 30.88 kg/m as calculated from the following:   Height as of this encounter: 5\' 4"  (1.626 m).   Weight as of this encounter: 81.6 kg.   DVT prophylaxis: Comfort Code Status: DNR Family Communication: Wife and son over phone.  Disposition Plan:  Status is: Observation  The patient will require care se panning > 2 midnights and should be moved to inpatient because: IV treatments appropriate due to intensity of illness or inability to take PO  Dispo: The patient is from: Home              Anticipated d/c is to: Home with hospice              Anticipated d/c date is: 2 days              Patient currently is not medically stable to d/c. requiring IV morphine for pain managent. Plan to discharge home with hospice tomorrow. wif        Consultants:   Palliative care  Procedures:   none  Antimicrobials:  none  Subjective: He appears in less pain. He wants to go home./  Objective: Vitals:   01/12/20 1148 01/12/20 1253 01/12/20 2224 01/13/20 0527  BP: (!) 108/45 (!) 114/56 (!) 141/56 (!) 155/71  Pulse:  79 87 93  Resp:  16 20 20   Temp:  97.6 F (36.4 C) 98.1 F (36.7 C) 98.8 F (37.1 C)  TempSrc:   Axillary Axillary  SpO2:  100% 100% 100%  Weight:  Height:       No intake or output data in the 24 hours ending 01/13/20 1215 Filed Weights   01/11/20 1546  Weight: 81.6 kg    Examination:  General exam: Chronic ill appearing Respiratory system: BL crackles.  Cardiovascular system: S 1, S 2 RRR Gastrointestinal system: BS present, soft, nt Central nervous system: alert Extremities: LE with thick skin and hyperpigmentation.    Data Reviewed: I have personally reviewed following labs and imaging studies  CBC: Recent Labs  Lab 01/11/20 1549 01/12/20 0403  WBC 13.9*  --   NEUTROABS 11.6*  --   HGB 6.9* 8.1*  HCT 24.5* 27.4*  MCV  89.1  --   PLT 509*  --    Basic Metabolic Panel: Recent Labs  Lab 01/11/20 1549  NA 134*  K 5.3*  CL 94*  CO2 25  GLUCOSE 152*  BUN 29*  CREATININE 1.88*  CALCIUM 8.4*   GFR: Estimated Creatinine Clearance: 28.2 mL/min (A) (by C-G formula based on SCr of 1.88 mg/dL (H)). Liver Function Tests: Recent Labs  Lab 01/11/20 1549  AST 35  ALT 12  ALKPHOS 324*  BILITOT 0.6  PROT 6.2*  ALBUMIN 1.8*   No results for input(s): LIPASE, AMYLASE in the last 168 hours. No results for input(s): AMMONIA in the last 168 hours. Coagulation Profile: No results for input(s): INR, PROTIME in the last 168 hours. Cardiac Enzymes: No results for input(s): CKTOTAL, CKMB, CKMBINDEX, TROPONINI in the last 168 hours. BNP (last 3 results) No results for input(s): PROBNP in the last 8760 hours. HbA1C: No results for input(s): HGBA1C in the last 72 hours. CBG: No results for input(s): GLUCAP in the last 168 hours. Lipid Profile: No results for input(s): CHOL, HDL, LDLCALC, TRIG, CHOLHDL, LDLDIRECT in the last 72 hours. Thyroid Function Tests: No results for input(s): TSH, T4TOTAL, FREET4, T3FREE, THYROIDAB in the last 72 hours. Anemia Panel: No results for input(s): VITAMINB12, FOLATE, FERRITIN, TIBC, IRON, RETICCTPCT in the last 72 hours. Sepsis Labs: No results for input(s): PROCALCITON, LATICACIDVEN in the last 168 hours.  Recent Results (from the past 240 hour(s))  SARS CORONAVIRUS 2 (TAT 6-24 HRS) Nasopharyngeal Nasopharyngeal Swab     Status: None   Collection Time: 01/11/20 11:33 PM   Specimen: Nasopharyngeal Swab  Result Value Ref Range Status   SARS Coronavirus 2 NEGATIVE NEGATIVE Final    Comment: (NOTE) SARS-CoV-2 target nucleic acids are NOT DETECTED.  The SARS-CoV-2 RNA is generally detectable in upper and lower respiratory specimens during the acute phase of infection. Negative results do not preclude SARS-CoV-2 infection, do not rule out co-infections with other  pathogens, and should not be used as the sole basis for treatment or other patient management decisions. Negative results must be combined with clinical observations, patient history, and epidemiological information. The expected result is Negative.  Fact Sheet for Patients: SugarRoll.be  Fact Sheet for Healthcare Providers: https://www.woods-mathews.com/  This test is not yet approved or cleared by the [place] FDA and  has been authorized for detection and/or diagnosis of SARS-CoV-2 by FDA under an Emergency Use Authorization (EUA). This EUA will remain  in effect (meaning this test can be used) for the duration of the COVID-19 declaration under Se ction 564(b)(1) of the Act, 21 U.S.C. section 360bbb-3(b)(1), unless the authorization is terminated or revoked sooner.  Performed at [facility], [place] [address] [place], [place] [address]          Radiology Studies: CT Head  Wo Contrast  Result Date: 01/11/2020 CLINICAL DATA:  Delirium. EXAM: CT HEAD WITHOUT CONTRAST TECHNIQUE: Contiguous axial images were obtained from the base of the skull through the vertex without intravenous contrast. COMPARISON:  Head CT 11/07/2013. FINDINGS: Brain: Mild cerebral and cerebellar atrophy. Mild ill-defined hypoattenuation within the cerebral white matter is nonspecific, but compatible with chronic small vessel ischemic disease. There is no acute intracranial hemorrhage. No demarcated cortical infarct. No extra-axial fluid collection. No evidence of intracranial mass. No midline shift. Vascular: No hyperdense vessel.  Atherosclerotic calcifications. Skull: Normal. Negative for fracture or focal lesion. Sinuses/Orbits: Visualized orbits show no acute finding. Mild scattered ethmoid sinus mucosal thickening. 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 IMPRESSION: No evidence of acute intracranial abnormality.  Mild generalized atrophy of the brain, progressed from the head CT of 11/07/2013. Stable mild cerebral white matter chronic small vessel ischemic disease. Mild ethmoid sinus mucosal thickening. 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 Electronically Signed   By: Jackey Loge DO   On: 01/11/2020 19:19        Scheduled Meds: . feeding supplement  1 Container Oral BID BM  .  morphine injection  2 mg Intravenous Once  . OLANZapine zydis  5 mg Oral Daily  . ondansetron (ZOFRAN) IV  4 mg Intravenous Once   Continuous Infusions:   LOS: 1 day    Time spent: 35 minutes.    Alba Cory, MD [facility]   If 7PM-7AM, please contact night-coverage [email or web address]  01/13/2020, 12:15 PM

## 2020-01-13 NOTE — Telephone Encounter (Signed)
Called and spoke with pt wife and pt is currently in hospital with plans of being discharged tomorrow.

## 2020-01-13 NOTE — Progress Notes (Signed)
Daily Progress Note   Patient Name: John Hutchinson.       Date: 01/13/2020 DOB: 1933-08-10  Age: 86 y.o. MRN#: [id number] Attending Physician: Elmarie Shiley, MD Primary Care Physician: Marin Olp, MD Admit Date: 01/11/2020  Reason for Consultation/Follow-up: Establishing goals of care  Subjective: More alert today - tells me he wants to go home, does not want aggressive interventions  Length of Stay: 1  Current Medications: Scheduled Meds:   feeding supplement  1 Container Oral BID BM    morphine injection  2 mg Intravenous Once   OLANZapine zydis  5 mg Oral Daily   ondansetron (ZOFRAN) IV  4 mg Intravenous Once    Continuous Infusions:   PRN Meds: alum & mag hydroxide-simeth, LORazepam **OR** LORazepam **OR** LORazepam, LORazepam, morphine injection, morphine CONCENTRATE **OR** morphine CONCENTRATE, ondansetron **OR** ondansetron (ZOFRAN) IV  Physical Exam Constitutional:      General: He is not in acute distress.    Comments: Drowsy with periods of alertness  Pulmonary:     Effort: Pulmonary effort is normal.  Musculoskeletal:     Right lower leg: Edema present.     Left lower leg: Edema present.  Skin:    General: Skin is warm and dry.             Vital Signs: BP (!) 155/71 (BP Location: Right Arm)    Pulse 93    Temp 98.8 F (37.1 C) (Axillary)    Resp 20    Ht 5\' 4"  (1.626 m)    Wt 81.6 kg    SpO2 100%    BMI 30.88 kg/m  SpO2: SpO2: 100 % O2 Device: O2 Device: Room Air O2 Flow Rate:    Intake/output summary: No intake or output data in the 24 hours ending 01/13/20 1212 LBM: Last BM Date: 01/12/20 Baseline Weight: Weight: 81.6 kg Most recent weight: Weight: 81.6 kg       Palliative Assessment/Data: PPS 20-30%    Flowsheet Rows   Flowsheet Row Most  Recent Value  Intake Tab   Referral Department Hospitalist  Unit at Time of Referral ER  Palliative Care Primary Diagnosis Cancer  Date Notified 01/11/20  Palliative Care Type New Palliative care  Reason for referral Clarify Goals of Care  Date of Admission 01/11/20  Date first seen by Palliative Care 01/12/20  # of days Palliative referral response time 1 Day(s)  # of days IP prior to Palliative referral 0  Clinical Assessment   Palliative Performance Scale Score 10%  Psychosocial & Spiritual Assessment   Palliative Care Outcomes   Patient/Family meeting held? Yes  Who was at the meeting? son and wife  Palliative Care Outcomes Clarified goals of care, Counseled regarding hospice, Provided end of life care assistance, Provided psychosocial or spiritual support      Patient Active Problem List   Diagnosis Date Noted   Symptomatic anemia 01/11/2020   Rectal cancer ([place]) 01/11/2020   Chronic kidney disease, stage 3b ([place]) 01/11/2020   Aortic atherosclerosis ([place]) 12/16/2019   Anemia 05/16/2017   Weight loss 05/16/2017   Goals of care, counseling/discussion 09/29/2015   BPH (benign prostatic hyperplasia) 03/09/2014   (HFpEF) heart failure  with preserved ejection fraction (HCC) 03/09/2014   CKD (chronic kidney disease), stage II 02/17/2014   Degenerative arthritis 09/28/2012   Hyperlipidemia 08/26/2006   Essential hypertension 08/26/2006   PROSTATE SPECIFIC ANTIGEN, ELEVATED 06/24/2006    Palliative Care Assessment & Plan   HPI: 86 y.o. male  with past medical history of HTN, CHF, CKD, and recently diagnosed with metastatic rectal cancer admitted on 01/11/2020 with worsening pain and fatigue. Patient has been increasingly fatigued and generally weak over the course of several months, has not walked on his own in a few months, and has been refusing to participate in physical therapy. He had a large rectal mass noted on physical exam recently and outpatient CT was  concerning for metastatic rectal cancer. Patient has indicated that he is not interested in any treatment for this.Patient's family supports his decision to focus on comfort only.PMT consulted to assist with end of life planning.   Assessment: F/u with patient per family request. He is more alert today though slips easily into sleep. He answers my questions with yes/no responses. He confirms he is only interested in comfort measures and wants to go home. I bring up hospice and describe their services. He confirms interest.   Called to speak with patient's wife and son. Shared discussed with patient. Bring up hospice - initially, wife states she does not want hospice. We discuss that hospice is in line with patient's wishes and can provide the support patient needs to be comfortable at home. She tells me she does not want anyone to talk about death at home. We talked through how to communicate her wishes with the hospice team. We discuss hospice philosophy of care. Wife and son both agree hospice is appropriate.  Discussion shared with Dr. Sunnie Nielsen and CSW.  Recommendations/Plan:  Continue comfort measures  Home with hospice when primary team determines appropriate  Goals of Care and Additional Recommendations:  Limitations on Scope of Treatment: Full Comfort Care  Code Status:  DNR  Prognosis:   weeks-months  Discharge Planning:  Home with Hospice  Care plan was discussed with patient, spouse, son, Dr. Sunnie Nielsen, and CSW  Thank you for allowing the Palliative Medicine Team to assist in the care of this patient.   Total Time 30 minutes Prolonged Time Billed  no       Greater than 50%  of this time was spent counseling and coordinating care related to the above assessment and plan.  Gerlean Ren, DNP, [facility] Palliative Medicine Team Team Phone # [phone]  Pager [phone]

## 2020-01-13 NOTE — Plan of Care (Signed)
  Problem: Education: Goal: Knowledge of General Education information will improve Description: Including pain rating scale, medication(s)/side effects and non-pharmacologic comfort measures Outcome: Progressing   Problem: Health Behavior/Discharge Planning: Goal: Ability to manage health-related needs will improve Outcome: Progressing   Problem: Clinical Measurements: Goal: Ability to maintain clinical measurements within normal limits will improve Outcome: Progressing Goal: Will remain free from infection Outcome: Progressing Goal: Diagnostic test results will improve Outcome: Progressing Goal: Respiratory complications will improve Outcome: Progressing Goal: Cardiovascular complication will be avoided Outcome: Progressing   Problem: Activity: Goal: Risk for activity intolerance will decrease Outcome: Progressing   Problem: Nutrition: Goal: Adequate nutrition will be maintained Outcome: Progressing   Problem: Coping: Goal: Level of anxiety will decrease Outcome: Progressing   Problem: Elimination: Goal: Will not experience complications related to bowel motility Outcome: Progressing Goal: Will not experience complications related to urinary retention Outcome: Progressing   Problem: Pain Managment: Goal: General experience of comfort will improve Outcome: Progressing   Problem: Safety: Goal: Ability to remain free from injury will improve Outcome: Progressing   Problem: Skin Integrity: Goal: Risk for impaired skin integrity will decrease Outcome: Progressing   Problem: Self-Care: Goal: Verbalization of feelings and concerns over difficulty with self-care will improve Outcome: Progressing   Problem: Education: Goal: Knowledge of disease or condition will improve Outcome: Progressing Goal: Knowledge of secondary prevention will improve Outcome: Progressing Goal: Individualized Educational Video(s) Outcome: Progressing   Problem: Coping: Goal: Will  identify appropriate support needs Outcome: Progressing   Problem: Education: Goal: Knowledge of secondary prevention will improve Outcome: Progressing Goal: Knowledge of patient specific risk factors addressed and post discharge goals established will improve Outcome: Progressing   Problem: Self-Care: Goal: Ability to communicate needs accurately will improve Outcome: Progressing   Problem: Nutrition: Goal: Dietary intake will improve Outcome: Progressing

## 2020-01-14 DIAGNOSIS — C20 Malignant neoplasm of rectum: Secondary | ICD-10-CM | POA: Diagnosis not present

## 2020-01-14 DIAGNOSIS — D649 Anemia, unspecified: Secondary | ICD-10-CM | POA: Diagnosis not present

## 2020-01-14 DIAGNOSIS — Z515 Encounter for palliative care: Secondary | ICD-10-CM | POA: Diagnosis not present

## 2020-01-14 DIAGNOSIS — Z7189 Other specified counseling: Secondary | ICD-10-CM | POA: Diagnosis not present

## 2020-01-14 DIAGNOSIS — R627 Adult failure to thrive: Secondary | ICD-10-CM | POA: Diagnosis not present

## 2020-01-14 MED ORDER — OLANZAPINE 5 MG PO TBDP: 5.0000 mg | ORAL_TABLET | Freq: Every day | ORAL | 0 refills | Status: AC

## 2020-01-14 MED ORDER — MORPHINE SULFATE (CONCENTRATE) 10 MG/0.5ML PO SOLN: 5.0000 mg | ORAL | 0 refills | Status: AC | PRN

## 2020-01-14 MED ORDER — LORAZEPAM 1 MG PO TABS: 1.0000 mg | ORAL_TABLET | ORAL | 0 refills | Status: AC | PRN

## 2020-01-14 MED ORDER — ONDANSETRON 4 MG PO TBDP: 4.0000 mg | ORAL_TABLET | Freq: Four times a day (QID) | ORAL | 0 refills | Status: AC | PRN

## 2020-01-14 NOTE — Progress Notes (Signed)
   01/14/20 1329  Clinical Encounter Type  Visited With Patient  Visit Type Initial  Referral From Nurse  Consult/Referral To Chaplain  Spiritual Encounters  Spiritual Needs Prayer   Chaplain responded to consult for prayer. Pt stated he wished to go home. Chaplain prayed with Pt. Pt was asleep when Chaplain left. Chaplain remains available as needed.   This note was prepared by Chaplain Resident, Tacy Learn, MDiv. For questions, please contact by phone at [phone].

## 2020-01-14 NOTE — Discharge Summary (Signed)
Physician Discharge Summary  John Hutchinson. WGN:[id number] DOB: 10-09-1933 DOA: 01/11/2020  PCP: Shelva Majestic, MD  Admit date: 01/11/2020 Discharge date: 01/14/2020  Admitted From: Home  Disposition: Home with [facility]  hospice care.   Recommendations for Outpatient Follow-up:  1. Comfort care.     Discharge Condition: Stable.  CODE STATUS: DNR Diet recommendation: Comfort feeding.   Brief/Interim Summary: 86 year old with past medical history significant for hypertension, chronic diastolic heart failure, chronic kidney disease recently diagnosed with metastatic rectal cancer, now presenting to the emergency department with worsening pain and fatigue.  Patient spouse and son were contacted by phone by admitting provider.  Patient has been increasingly fatigued and generally weak over the course of several months.  Has not walk on his own in few months and has been refusing to participate in physical therapy.  He had a large rectal mass noted on physical exam recently an outpatient CT was concerning for metastatic rectal cancer.  Patient has indicated that he is not interested in any treatment for this.  Patient family support his decision to focus on comfort only.  Palliative care has been consulted.  He was found to have a hemoglobin 6.9.  Family was okay with 1 unit of packed red blood cell transfusion.   1-Symptomatic  Anemia: secondary to neoplasia.  Patient presented with weakness, likely related to anemia and metastatic cancer. Patient received 1 unit of packed red blood cell Repeated Hemoglobin at 8.1  2-Metastatic rectal cancer; Recently diagnosed as an outpatient. Patient and family  Are  not interested in treatment Palliative care consulted. Patient and family decide to proceed with comfort care, home with hospice at discharge.  IV morphine PRN for pain.  Will provide pain medication at discharge.   3-CKD stage IIIb: Creatinine baseline 1.7 Comfort care no  further lab drawn.   4-Hypertension: continue to hold BP medication, BP low./  5-Chronic diastolic heart failure: appears hypovolemic Hold diuretics 6-Hypotension; comfort care. Hold BP medication. He is still alert.   Discharge Diagnoses:  Principal Problem:   Symptomatic anemia Active Problems:   (HFpEF) heart failure with preserved ejection fraction (HCC)   Rectal cancer (HCC)   Chronic kidney disease, stage 3b [facility])    Discharge Instructions  Discharge Instructions    Diet - low sodium heart healthy   Complete by: As directed    Increase activity slowly   Complete by: As directed      Allergies as of 01/14/2020      Reactions   Penicillins Other (See Comments)   unknown      Medication List    STOP taking these medications   amLODipine 10 MG tablet Commonly known as: NORVASC   atorvastatin 20 MG tablet Commonly known as: LIPITOR   carvedilol 25 MG tablet Commonly known as: COREG   furosemide 40 MG tablet Commonly known as: LASIX   losartan 100 MG tablet Commonly known as: COZAAR   multivitamin capsule   tamsulosin 0.4 MG Caps capsule Commonly known as: FLOMAX   traMADol 50 MG tablet Commonly known as: ULTRAM     TAKE these medications   LORazepam 1 MG tablet Commonly known as: ATIVAN Take 1 tablet (1 mg total) by mouth every 4 (four) hours as needed for anxiety.   morphine CONCENTRATE 10 MG/0.5ML Soln concentrated solution Take 0.25 mLs (5 mg total) by mouth every 2 (two) hours as needed for moderate pain (or dyspnea).   OLANZapine zydis 5 MG disintegrating tablet Commonly known  as: ZYPREXA Take 1 tablet (5 mg total) by mouth daily.   ondansetron 4 MG disintegrating tablet Commonly known as: ZOFRAN-ODT Take 1 tablet (4 mg total) by mouth every 6 (six) hours as needed for nausea.       Allergies  Allergen Reactions  . Penicillins Other (See Comments)    unknown    Consultations:  Palliative care   Procedures/Studies: CT  ABDOMEN PELVIS WO CONTRAST  Result Date: 12/15/2019 CLINICAL DATA:  Newly diagnosed colorectal carcinoma.  Staging. EXAM: CT ABDOMEN AND PELVIS WITHOUT CONTRAST TECHNIQUE: Multidetector CT imaging of the abdomen and pelvis was performed following the standard protocol without IV contrast. COMPARISON:  None. FINDINGS: Lower chest: Mild pericardial effusion versus pericardial thickening. Hepatobiliary: No mass visualized on this unenhanced exam. Gallstones are seen, however there is no evidence of cholecystitis. Diffuse biliary ductal dilatation is seen with common bile duct measuring 12 mm. Pancreas: No mass or inflammatory process visualized on this unenhanced exam. Spleen:  Within normal limits in size. Adrenals/Urinary tract: Multiple fluid attenuation cysts are seen in both kidneys. No evidence of ureteral calculi or hydronephrosis. Unremarkable unopacified urinary bladder. Stomach/Bowel: Concentric wall thickening is seen involving the rectum, consistent with rectal carcinoma. No evidence of bowel obstruction. Colonic diverticulosis is noted, however there is no evidence of diverticulitis. Vascular/Lymphatic: Numerous enlarged perirectal lymph nodes are seen measuring up to 1.3 cm, consistent with local lymph node metastases. Mild extra-mesorectal lymphadenopathy is also seen throughout both iliac chains, with index lymph node in the left external iliac chain measuring 13 mm on image 72/3. Retroperitoneal lymphadenopathy is also seen in the aortocaval and left paraaortic spaces, with largest lymph node in the left paraaortic region measuring 1.3 cm on image 39/3. Mild bilateral inguinal lymphadenopathy is also seen with largest lymph node on the left measuring 1.5 cm. Aortic atherosclerotic calcification noted. No evidence of abdominal aortic aneurysm. Reproductive:  Mild-to-moderate prostate gland enlargement noted. Other: A large right inguinal hernia is seen containing multiple small bowel loops and a  portion of the urinary bladder. Musculoskeletal: No suspicious bone lesions identified. Severe left hip osteoarthritis and moderate right hip osteoarthritis are noted. IMPRESSION: Concentric wall thickening involving the rectum, consistent with rectal carcinoma. Mild perirectal, bilateral iliac, bilateral inguinal, and abdominal retroperitoneal lymphadenopathy, consistent with metastatic disease. Cholelithiasis. No radiographic evidence of cholecystitis. Diffuse biliary ductal dilatation. No etiology visualized by CT. Recommend correlation with liver function tests, and consider abdomen MRI and MRCP without and with contrast for further evaluation. Colonic diverticulosis. No radiographic evidence of diverticulitis. Large right inguinal hernia containing multiple small bowel loops and a portion of the urinary bladder. Mild-to-moderate prostate gland enlargement. Electronically Signed   By: Marlaine Hind M.D.   On: 12/15/2019 15:50   CT Head Wo Contrast  Result Date: 01/11/2020 CLINICAL DATA:  Delirium. EXAM: CT HEAD WITHOUT CONTRAST TECHNIQUE: Contiguous axial images were obtained from the base of the skull through the vertex without intravenous contrast. COMPARISON:  Head CT 11/07/2013. FINDINGS: Brain: Mild cerebral and cerebellar atrophy. Mild ill-defined hypoattenuation within the cerebral white matter is nonspecific, but compatible with chronic small vessel ischemic disease. There is no acute intracranial hemorrhage. No demarcated cortical infarct. No extra-axial fluid collection. No evidence of intracranial mass. No midline shift. Vascular: No hyperdense vessel.  Atherosclerotic calcifications. Skull: Normal. Negative for fracture or focal lesion. Sinuses/Orbits: Visualized orbits show no acute finding. Mild scattered ethmoid sinus mucosal thickening. 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 IMPRESSION:  No evidence of acute intracranial abnormality. Mild  generalized atrophy of the brain, progressed from the head CT of 11/07/2013. Stable mild cerebral white matter chronic small vessel ischemic disease. Mild ethmoid sinus mucosal thickening. 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 Electronically Signed   By: Kellie Simmering DO   On: 01/11/2020 19:19      Subjective: Alert, denies pain. He smile when I told him he was going home.   Discharge Exam: Vitals:   01/13/20 1338 01/14/20 0812  BP: (!) 147/57 (!) 70/40  Pulse: (!) 102 88  Resp: 16 12  Temp: 98.2 F (36.8 C) 98.2 F (36.8 C)  SpO2:  100%     General: John Hutchinson is alert, awake, not in acute distress Cardiovascular: RRR, S1/S2 +, no rubs, no gallops Respiratory: CTA bilaterally, no wheezing, no rhonchi Abdominal: Soft, NT, ND, bowel sounds + Extremities: no edema, no cyanosis    The results of significant diagnostics from this hospitalization (including imaging, microbiology, ancillary and laboratory) are listed below for reference.     Microbiology: Recent Results (from the past 240 hour(s))  SARS CORONAVIRUS 2 (TAT 6-24 HRS) Nasopharyngeal Nasopharyngeal Swab     Status: None   Collection Time: 01/11/20 11:33 PM   Specimen: Nasopharyngeal Swab  Result Value Ref Range Status   SARS Coronavirus 2 NEGATIVE NEGATIVE Final    Comment: (NOTE) SARS-CoV-2 target nucleic acids are NOT DETECTED.  The SARS-CoV-2 RNA is generally detectable in upper and lower respiratory specimens during the acute phase of infection. Negative results do not preclude SARS-CoV-2 infection, do not rule out co-infections with other pathogens, and should not be used as the sole basis for treatment or other patient management decisions. Negative results must be combined with clinical observations, patient history, and epidemiological information. The expected result is Negative.  Fact Sheet for Patients: SugarRoll.be  Fact Sheet for  Healthcare Providers: https://www.woods-mathews.com/  This test is not yet approved or cleared by the [place] FDA and  has been authorized for detection and/or diagnosis of SARS-CoV-2 by FDA under an Emergency Use Authorization (EUA). This EUA will remain  in effect (meaning this test can be used) for the duration of the COVID-19 declaration under Se ction 564(b)(1) of the Act, 21 U.S.C. section 360bbb-3(b)(1), unless the authorization is terminated or revoked sooner.  Performed at [facility], [place] [address], [place], [place] [address]      Labs: BNP (last 3 results) No results for input(s): BNP in the last 8760 hours. Basic Metabolic Panel: Recent Labs  Lab 01/11/20 1549  NA 134*  K 5.3*  CL 94*  CO2 25  GLUCOSE 152*  BUN 29*  CREATININE 1.88*  CALCIUM 8.4*   Liver Function Tests: Recent Labs  Lab 01/11/20 1549  AST 35  ALT 12  ALKPHOS 324*  BILITOT 0.6  PROT 6.2*  ALBUMIN 1.8*   No results for input(s): LIPASE, AMYLASE in the last 168 hours. No results for input(s): AMMONIA in the last 168 hours. CBC: Recent Labs  Lab 01/11/20 1549 01/12/20 0403  WBC 13.9*  --   NEUTROABS 11.6*  --   HGB 6.9* 8.1*  HCT 24.5* 27.4*  MCV 89.1  --   PLT 509*  --    Cardiac Enzymes: No results for input(s): CKTOTAL, CKMB, CKMBINDEX, TROPONINI in the last 168 hours. BNP: Invalid input(s): POCBNP CBG: No results for input(s): GLUCAP in the last 168 hours. D-Dimer No results for input(s):  DDIMER in the last 72 hours. Hgb A1c No results for input(s): HGBA1C in the last 72 hours. Lipid Profile No results for input(s): CHOL, HDL, LDLCALC, TRIG, CHOLHDL, LDLDIRECT in the last 72 hours. Thyroid function studies No results for input(s): TSH, T4TOTAL, T3FREE, THYROIDAB in the last 72 hours.  Invalid input(s): FREET3 Anemia work up No results for input(s): VITAMINB12, FOLATE, FERRITIN, TIBC, IRON, RETICCTPCT in the last 72 hours. Urinalysis     Component Value Date/Time   BILIRUBINUR Negative 01/01/2018 1626   PROTEINUR Negative 01/01/2018 1626   UROBILINOGEN 0.2 01/01/2018 1626   NITRITE Negative 01/01/2018 1626   LEUKOCYTESUR Negative 01/01/2018 1626   Sepsis Labs Invalid input(s): PROCALCITONIN,  WBC,  LACTICIDVEN Microbiology Recent Results (from the past 240 hour(s))  SARS CORONAVIRUS 2 (TAT 6-24 HRS) Nasopharyngeal Nasopharyngeal Swab     Status: None   Collection Time: 01/11/20 11:33 PM   Specimen: Nasopharyngeal Swab  Result Value Ref Range Status   SARS Coronavirus 2 NEGATIVE NEGATIVE Final    Comment: (NOTE) SARS-CoV-2 target nucleic acids are NOT DETECTED.  The SARS-CoV-2 RNA is generally detectable in upper and lower respiratory specimens during the acute phase of infection. Negative results do not preclude SARS-CoV-2 infection, do not rule out co-infections with other pathogens, and should not be used as the sole basis for treatment or other patient management decisions. Negative results must be combined with clinical observations, patient history, and epidemiological information. The expected result is Negative.  Fact Sheet for Patients: SugarRoll.be  Fact Sheet for Healthcare Providers: https://www.woods-mathews.com/  This test is not yet approved or cleared by the [place] FDA and  has been authorized for detection and/or diagnosis of SARS-CoV-2 by FDA under an Emergency Use Authorization (EUA). This EUA will remain  in effect (meaning this test can be used) for the duration of the COVID-19 declaration under Se ction 564(b)(1) of the Act, 21 U.S.C. section 360bbb-3(b)(1), unless the authorization is terminated or revoked sooner.  Performed at [facility], [place] [address], [place], [place] [address]      Time coordinating discharge: 40 minutes  SIGNED:   Elmarie Shiley, MD  [facility]

## 2020-01-14 NOTE — TOC Progression Note (Signed)
Transition of Care (TOC) - Progression Note    Patient Details  Name: John Hutchinson. MRN: [id number] Date of Birth: 1933/05/28  Transition of Care [facility]) CM/SW Contact  [address], [place], [place] Phone Number: 01/14/2020, 1:34 PM  Clinical Narrative:    Patient to discharge home with Hospice through [facility]. Patient to be transported through [facility]. Patient's spouse contacted and has been made aware.  Madelin Weseman [address], [place] Transition of Care [phone]    Expected Discharge Plan: Home w Hospice Care Barriers to Discharge: Continued Medical Work up  Expected Discharge Plan and Services Expected Discharge Plan: Home w Hospice Care     Post Acute Care Choice: Hospice Living arrangements for the past 2 months: Single Family Home Expected Discharge Date: 01/14/20                                     Social Determinants of Health (SDOH) Interventions    Readmission Risk Interventions No flowsheet data found.

## 2020-01-14 NOTE — Progress Notes (Signed)
Pt requested chaplain for prayer.

## 2020-01-14 NOTE — Progress Notes (Signed)
Daily Progress Note   Patient Name: John Hutchinson.       Date: 01/14/2020 DOB: 10/27/1933  Age: 86 y.o. MRN#: [id number] Attending Physician: Elmarie Shiley, MD Primary Care Physician: Marin Olp, MD Admit Date: 01/11/2020  Reason for Consultation/Follow-up: Establishing goals of care  Subjective: Wants to go home, denies pain  Length of Stay: 2  Current Medications: Scheduled Meds:  . feeding supplement  1 Container Oral BID BM  .  morphine injection  2 mg Intravenous Once  . OLANZapine zydis  5 mg Oral Daily  . ondansetron (ZOFRAN) IV  4 mg Intravenous Once    Continuous Infusions:   PRN Meds: alum & mag hydroxide-simeth, LORazepam **OR** LORazepam **OR** LORazepam, LORazepam, morphine injection, morphine CONCENTRATE **OR** morphine CONCENTRATE, ondansetron **OR** ondansetron (ZOFRAN) IV  Physical Exam Constitutional:      General: He is not in acute distress. Pulmonary:     Effort: Pulmonary effort is normal.  Musculoskeletal:     Right lower leg: Edema present.     Left lower leg: Edema present.  Skin:    General: Skin is warm and dry.  Neurological:     Mental Status: He is alert.             Vital Signs: BP (!) 70/40 (BP Location: Left Arm)   Pulse 88   Temp 98.2 F (36.8 C) (Axillary)   Resp 12   Ht 5\' 4"  (1.626 m)   Wt 81.6 kg   SpO2 100%   BMI 30.88 kg/m  SpO2: SpO2: 100 % O2 Device: O2 Device: Room Air O2 Flow Rate:    Intake/output summary: No intake or output data in the 24 hours ending 01/14/20 0933 LBM: Last BM Date: 01/12/20 Baseline Weight: Weight: 81.6 kg Most recent weight: Weight: 81.6 kg       Palliative Assessment/Data: PPS 20%    Flowsheet Rows   Flowsheet Row Most Recent Value  Intake Tab   Referral Department Hospitalist   Unit at Time of Referral ER  Palliative Care Primary Diagnosis Cancer  Date Notified 01/11/20  Palliative Care Type New Palliative care  Reason for referral Clarify Goals of Care  Date of Admission 01/11/20  Date first seen by Palliative Care 01/12/20  # of days Palliative referral response time 1 Day(s)  # of days IP prior to Palliative referral 0  Clinical Assessment   Palliative Performance Scale Score 10%  Psychosocial & Spiritual Assessment   Palliative Care Outcomes   Patient/Family meeting held? Yes  Who was at the meeting? son and wife  Palliative Care Outcomes Clarified goals of care, Counseled regarding hospice, Provided end of life care assistance, Provided psychosocial or spiritual support      Patient Active Problem List   Diagnosis Date Noted  . Symptomatic anemia 01/11/2020  . Rectal cancer ([place]) 01/11/2020  . Chronic kidney disease, stage 3b ([place]) 01/11/2020  . Aortic atherosclerosis ([place]) 12/16/2019  . Anemia 05/16/2017  . Weight loss 05/16/2017  . Goals of care, counseling/discussion 09/29/2015  . BPH (benign prostatic hyperplasia) 03/09/2014  . (HFpEF) heart failure with preserved ejection fraction ([place]) 03/09/2014  . CKD (chronic kidney disease), stage II 02/17/2014  .  Degenerative arthritis 09/28/2012  . Hyperlipidemia 08/26/2006  . Essential hypertension 08/26/2006  . PROSTATE SPECIFIC ANTIGEN, ELEVATED 06/24/2006    Palliative Care Assessment & Plan   HPI: 86 y.o.malewith past medical history of HTN, CHF, CKD, and recently diagnosed with metastatic rectal canceradmitted on 12/28/2021with worsening pain and fatigue.Patient has been increasingly fatigued and generally weak over the course of several months, has not walked on his own in a few months, and has been refusing to participate in physical therapy. He had a large rectal mass noted on physical exam recently and outpatient CT was concerning for metastatic rectal cancer. Patient has  indicated that he is not interested in any treatment for this.Patient's family supports his decision to focus on comfort only.PMT consulted to assist with end of life planning.  Assessment: Patient asking to go home. Asked by Dr. Sunnie Nielsen to check in with family. Discussed with family patient's desire to go home and hospice arrangements. Family in agreement. Discussed low BP likely from medications/poor PO intake - to be expected. They express understanding.  We briefly review medications he is being discharged on and medications that are being stopped. Referred them to speak more with hospice and CSW about logistics of discharge home. They are in agreement.   Recommendations/Plan:  Comfort measures  Home with hospice today  Goals of Care and Additional Recommendations:  Limitations on Scope of Treatment: Full Comfort Care  Code Status:  DNR  Prognosis:   weeks-months  Discharge Planning:  Home with Hospice  Care plan was discussed with family, Dr. Sunnie Nielsen  Thank you for allowing the Palliative Medicine Team to assist in the care of this patient.   Total Time 20 minutes Prolonged Time Billed  no       Greater than 50%  of this time was spent counseling and coordinating care related to the above assessment and plan.  Gerlean Ren, DNP, [facility] Palliative Medicine Team Team Phone # [phone]  Pager [phone]

## 2020-01-14 NOTE — Progress Notes (Signed)
Returned daughter-Sonja phone call. N/A at this time.

## 2020-01-15 DIAGNOSIS — I10 Essential (primary) hypertension: Secondary | ICD-10-CM | POA: Diagnosis not present

## 2020-01-15 DIAGNOSIS — I509 Heart failure, unspecified: Secondary | ICD-10-CM | POA: Diagnosis not present

## 2020-01-15 DIAGNOSIS — Z66 Do not resuscitate: Secondary | ICD-10-CM | POA: Diagnosis not present

## 2020-01-15 DIAGNOSIS — M6281 Muscle weakness (generalized): Secondary | ICD-10-CM | POA: Diagnosis not present

## 2020-01-15 DIAGNOSIS — C20 Malignant neoplasm of rectum: Secondary | ICD-10-CM | POA: Diagnosis not present

## 2020-01-15 DIAGNOSIS — D649 Anemia, unspecified: Secondary | ICD-10-CM | POA: Diagnosis not present

## 2020-01-15 DIAGNOSIS — Z515 Encounter for palliative care: Secondary | ICD-10-CM | POA: Diagnosis not present

## 2020-01-15 DIAGNOSIS — N183 Chronic kidney disease, stage 3 unspecified: Secondary | ICD-10-CM | POA: Diagnosis not present

## 2020-01-15 NOTE — Progress Notes (Deleted)
  Phone [phone]   Subjective:  John Hutchinson. is a 87 y.o. year old very pleasant male patient who presents for transitional care management and hospital follow up for ***. Patient was hospitalized from 01/11/20 to 01/14/20. A TCM phone call was completed on ***. Medical complexity ***   ***   See problem oriented charting as well  Past Medical History-  Patient Active Problem List   Diagnosis Date Noted  . Symptomatic anemia 01/11/2020  . Rectal cancer (HCC) 01/11/2020  . Chronic kidney disease, stage 3b (HCC) 01/11/2020  . Aortic atherosclerosis (HCC) 12/16/2019  . Anemia 05/16/2017  . Weight loss 05/16/2017  . Goals of care, counseling/discussion 09/29/2015  . BPH (benign prostatic hyperplasia) 03/09/2014  . (HFpEF) heart failure with preserved ejection fraction (HCC) 03/09/2014  . CKD (chronic kidney disease), stage II 02/17/2014  . Degenerative arthritis 09/28/2012  . Hyperlipidemia 08/26/2006  . Essential hypertension 08/26/2006  . PROSTATE SPECIFIC ANTIGEN, ELEVATED 06/24/2006    Medications- reviewed and updated  A medical reconciliation was performed comparing current medicines to hospital discharge medications. Current Outpatient Medications  Medication Sig Dispense Refill  . LORazepam (ATIVAN) 1 MG tablet Take 1 tablet (1 mg total) by mouth every 4 (four) hours as needed for anxiety. 30 tablet 0  . Morphine Sulfate (MORPHINE CONCENTRATE) 10 MG/0.5ML SOLN concentrated solution Take 0.25 mLs (5 mg total) by mouth every 2 (two) hours as needed for moderate pain (or dyspnea). 180 mL 0  . OLANZapine zydis (ZYPREXA) 5 MG disintegrating tablet Take 1 tablet (5 mg total) by mouth daily. 30 tablet 0  . ondansetron (ZOFRAN-ODT) 4 MG disintegrating tablet Take 1 tablet (4 mg total) by mouth every 6 (six) hours as needed for nausea. 20 tablet 0   No current facility-administered medications for this visit.   Objective  Objective:  There were no vitals taken for this  visit. Gen: NAD, resting comfortably CV: RRR no murmurs rubs or gallops Lungs: CTAB no crackles, wheeze, rhonchi Abdomen: soft/nontender/nondistended/normal bowel sounds. No rebound or guarding.  Ext: no edema Skin: warm, dry Neuro: grossly normal, moves all extremities  ***   Assessment and Plan:   ***   No problem-specific Assessment & Plan notes found for this encounter.   Recommended follow up: ***No follow-ups on file. Future Appointments  Date Time Provider Department Center  01/17/2020  1:20 PM Shelva Majestic, MD [facility]    Lab/Order associations: No diagnosis found.  No orders of the defined types were placed in this encounter.   Return precautions advised.  Lieutenant Diego, CMA

## 2020-01-15 NOTE — Patient Instructions (Incomplete)
Depression screen [facility] 2/9 07/15/2019 10/29/2018 08/15/2017  Decreased Interest 0 0 0  Down, Depressed, Hopeless 0 0 0  PHQ - 2 Score 0 0 0

## 2020-01-15 NOTE — Telephone Encounter (Signed)
Thanks for update. Glad he sought care.

## 2020-01-17 ENCOUNTER — Ambulatory Visit: Payer: Medicare Other | Admitting: Family Medicine

## 2020-01-17 ENCOUNTER — Telehealth: Payer: Self-pay

## 2020-01-17 DIAGNOSIS — D649 Anemia, unspecified: Secondary | ICD-10-CM | POA: Diagnosis not present

## 2020-01-17 DIAGNOSIS — I509 Heart failure, unspecified: Secondary | ICD-10-CM | POA: Diagnosis not present

## 2020-01-17 DIAGNOSIS — M6281 Muscle weakness (generalized): Secondary | ICD-10-CM | POA: Diagnosis not present

## 2020-01-17 DIAGNOSIS — C20 Malignant neoplasm of rectum: Secondary | ICD-10-CM | POA: Diagnosis not present

## 2020-01-17 DIAGNOSIS — I10 Essential (primary) hypertension: Secondary | ICD-10-CM | POA: Diagnosis not present

## 2020-01-17 DIAGNOSIS — N183 Chronic kidney disease, stage 3 unspecified: Secondary | ICD-10-CM | POA: Diagnosis not present

## 2020-01-17 NOTE — Telephone Encounter (Signed)
FYI

## 2020-01-17 NOTE — Telephone Encounter (Signed)
Thanks for update- I am willing to sign for hospice orders/serve as attending

## 2020-01-17 NOTE — Telephone Encounter (Signed)
Patients wife calling to let nurse know that Mr. Manor is now home from the hospital and hospice is coming in to assist

## 2020-01-18 DIAGNOSIS — N183 Chronic kidney disease, stage 3 unspecified: Secondary | ICD-10-CM | POA: Diagnosis not present

## 2020-01-18 DIAGNOSIS — I10 Essential (primary) hypertension: Secondary | ICD-10-CM | POA: Diagnosis not present

## 2020-01-18 DIAGNOSIS — C20 Malignant neoplasm of rectum: Secondary | ICD-10-CM | POA: Diagnosis not present

## 2020-01-18 DIAGNOSIS — D649 Anemia, unspecified: Secondary | ICD-10-CM | POA: Diagnosis not present

## 2020-01-18 DIAGNOSIS — M6281 Muscle weakness (generalized): Secondary | ICD-10-CM | POA: Diagnosis not present

## 2020-01-18 DIAGNOSIS — I509 Heart failure, unspecified: Secondary | ICD-10-CM | POA: Diagnosis not present

## 2020-01-19 DIAGNOSIS — C20 Malignant neoplasm of rectum: Secondary | ICD-10-CM | POA: Diagnosis not present

## 2020-01-19 DIAGNOSIS — M6281 Muscle weakness (generalized): Secondary | ICD-10-CM | POA: Diagnosis not present

## 2020-01-19 DIAGNOSIS — N183 Chronic kidney disease, stage 3 unspecified: Secondary | ICD-10-CM | POA: Diagnosis not present

## 2020-01-19 DIAGNOSIS — D649 Anemia, unspecified: Secondary | ICD-10-CM | POA: Diagnosis not present

## 2020-01-19 DIAGNOSIS — I509 Heart failure, unspecified: Secondary | ICD-10-CM | POA: Diagnosis not present

## 2020-01-19 DIAGNOSIS — I10 Essential (primary) hypertension: Secondary | ICD-10-CM | POA: Diagnosis not present

## 2020-01-20 DIAGNOSIS — M6281 Muscle weakness (generalized): Secondary | ICD-10-CM | POA: Diagnosis not present

## 2020-01-20 DIAGNOSIS — D649 Anemia, unspecified: Secondary | ICD-10-CM | POA: Diagnosis not present

## 2020-01-20 DIAGNOSIS — I509 Heart failure, unspecified: Secondary | ICD-10-CM | POA: Diagnosis not present

## 2020-01-20 DIAGNOSIS — C20 Malignant neoplasm of rectum: Secondary | ICD-10-CM | POA: Diagnosis not present

## 2020-01-20 DIAGNOSIS — I10 Essential (primary) hypertension: Secondary | ICD-10-CM | POA: Diagnosis not present

## 2020-01-20 DIAGNOSIS — N183 Chronic kidney disease, stage 3 unspecified: Secondary | ICD-10-CM | POA: Diagnosis not present

## 2020-01-21 DIAGNOSIS — C20 Malignant neoplasm of rectum: Secondary | ICD-10-CM | POA: Diagnosis not present

## 2020-01-21 DIAGNOSIS — M6281 Muscle weakness (generalized): Secondary | ICD-10-CM | POA: Diagnosis not present

## 2020-01-21 DIAGNOSIS — I509 Heart failure, unspecified: Secondary | ICD-10-CM | POA: Diagnosis not present

## 2020-01-21 DIAGNOSIS — D649 Anemia, unspecified: Secondary | ICD-10-CM | POA: Diagnosis not present

## 2020-01-21 DIAGNOSIS — I10 Essential (primary) hypertension: Secondary | ICD-10-CM | POA: Diagnosis not present

## 2020-01-21 DIAGNOSIS — N183 Chronic kidney disease, stage 3 unspecified: Secondary | ICD-10-CM | POA: Diagnosis not present

## 2020-01-22 DIAGNOSIS — D649 Anemia, unspecified: Secondary | ICD-10-CM | POA: Diagnosis not present

## 2020-01-22 DIAGNOSIS — I10 Essential (primary) hypertension: Secondary | ICD-10-CM | POA: Diagnosis not present

## 2020-01-22 DIAGNOSIS — C20 Malignant neoplasm of rectum: Secondary | ICD-10-CM | POA: Diagnosis not present

## 2020-01-22 DIAGNOSIS — M6281 Muscle weakness (generalized): Secondary | ICD-10-CM | POA: Diagnosis not present

## 2020-01-22 DIAGNOSIS — N183 Chronic kidney disease, stage 3 unspecified: Secondary | ICD-10-CM | POA: Diagnosis not present

## 2020-01-22 DIAGNOSIS — I509 Heart failure, unspecified: Secondary | ICD-10-CM | POA: Diagnosis not present

## 2020-01-23 DIAGNOSIS — C20 Malignant neoplasm of rectum: Secondary | ICD-10-CM | POA: Diagnosis not present

## 2020-01-23 DIAGNOSIS — M6281 Muscle weakness (generalized): Secondary | ICD-10-CM | POA: Diagnosis not present

## 2020-01-23 DIAGNOSIS — D649 Anemia, unspecified: Secondary | ICD-10-CM | POA: Diagnosis not present

## 2020-01-23 DIAGNOSIS — N183 Chronic kidney disease, stage 3 unspecified: Secondary | ICD-10-CM | POA: Diagnosis not present

## 2020-01-23 DIAGNOSIS — I509 Heart failure, unspecified: Secondary | ICD-10-CM | POA: Diagnosis not present

## 2020-01-23 DIAGNOSIS — I10 Essential (primary) hypertension: Secondary | ICD-10-CM | POA: Diagnosis not present

## 2020-02-15 DEATH — deceased

## 2021-09-13 IMAGING — CT CT HEAD W/O CM
4 series · 17 of 47 positions shown, 19 images · non-contrast
Comparison: Head CT 11/07/2013.

CLINICAL DATA: Delirium.

EXAM:
CT HEAD WITHOUT CONTRAST
TECHNIQUE: Contiguous axial images were obtained from the base of the skull
through the vertex without intravenous contrast.

[Series 3: head wo · axial · 0.47mm/px · z∈[+1355,+1475]mm · 7 of 33 slices shown, 9 images]
[im 5/33  brain]
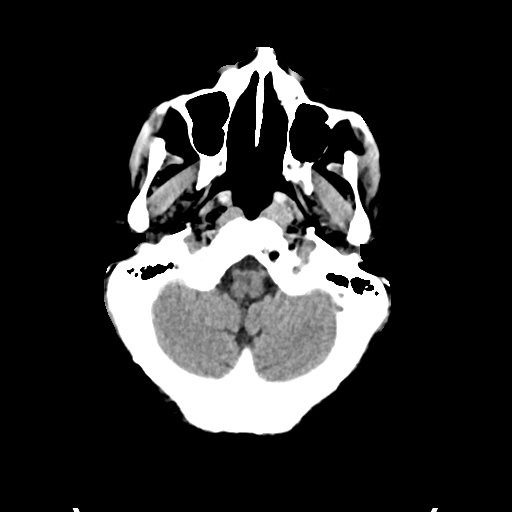
[im 5/33  bone]
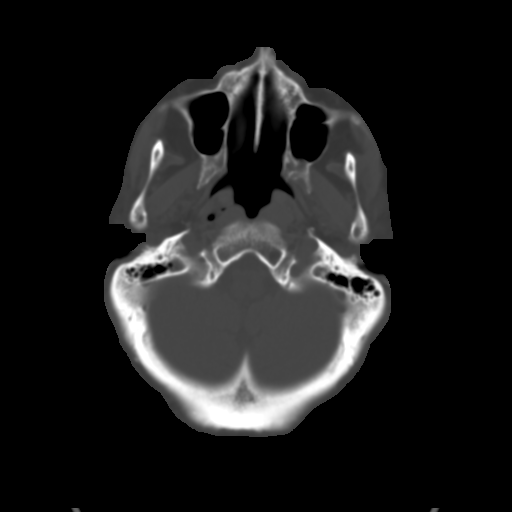
[im 9/33  brain]
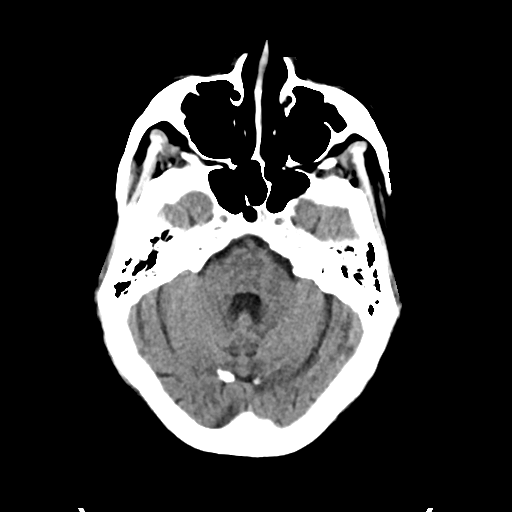
[im 13/33  brain]
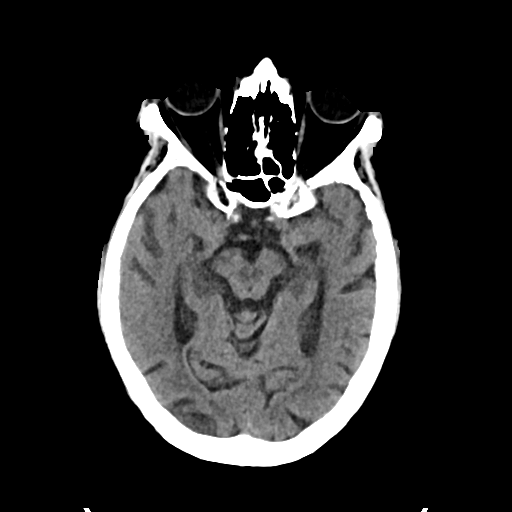
[im 17/33  brain]
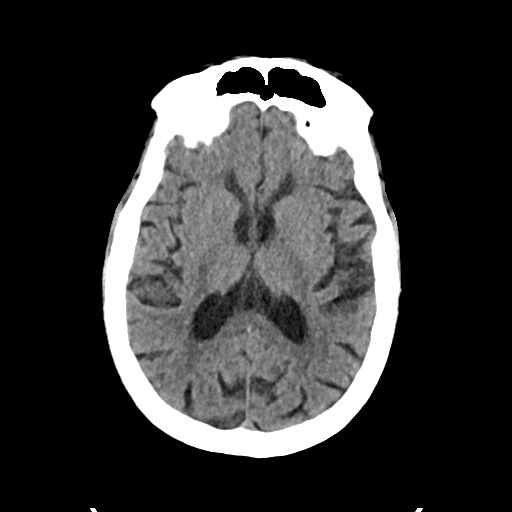
[im 21/33  brain]
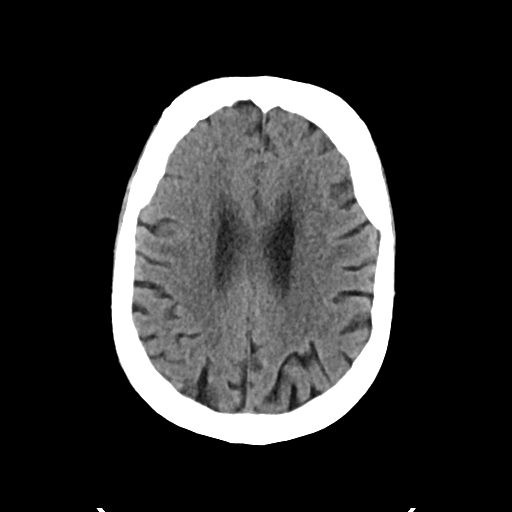
[im 21/33  bone]
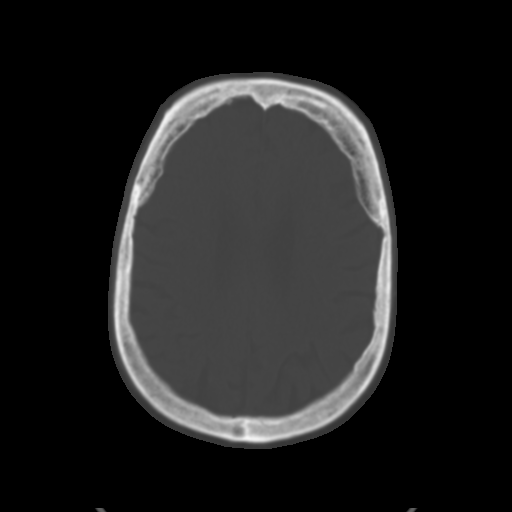
[im 25/33  brain]
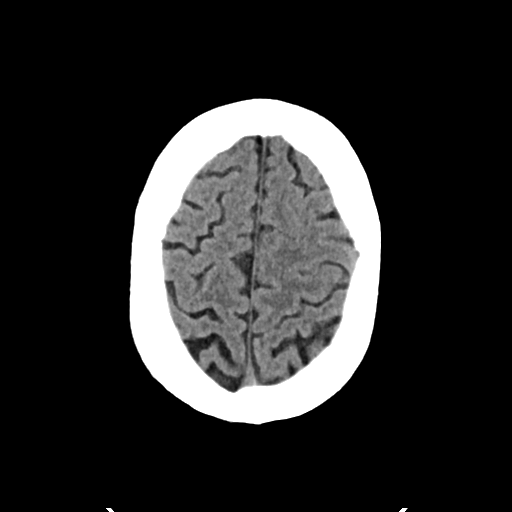
[im 29/33  brain]
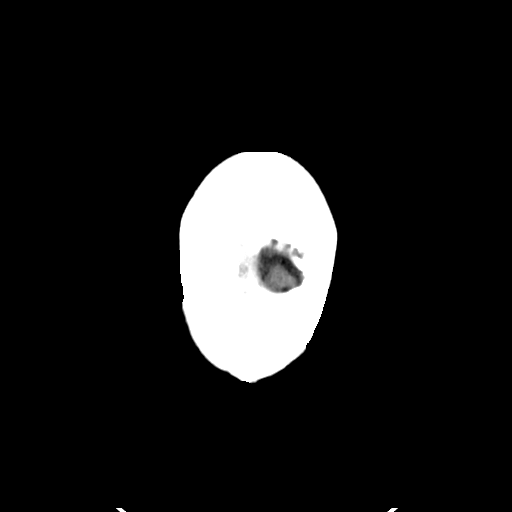

[Series 4: head bone · axial · 0.47mm/px · z∈[+1351,+1407]mm · 4 of 81 slices shown]
[im 9/81  bone]
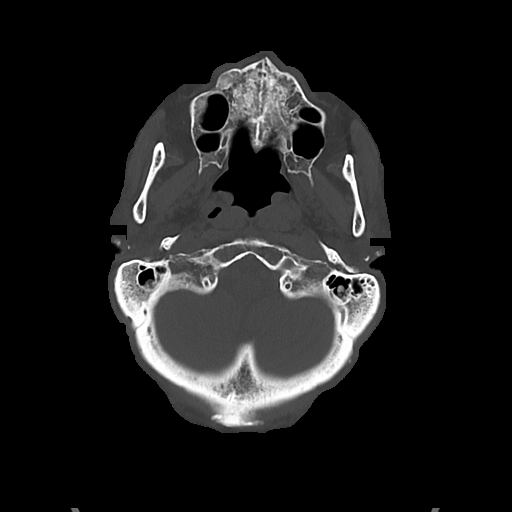
[im 17/81  bone]
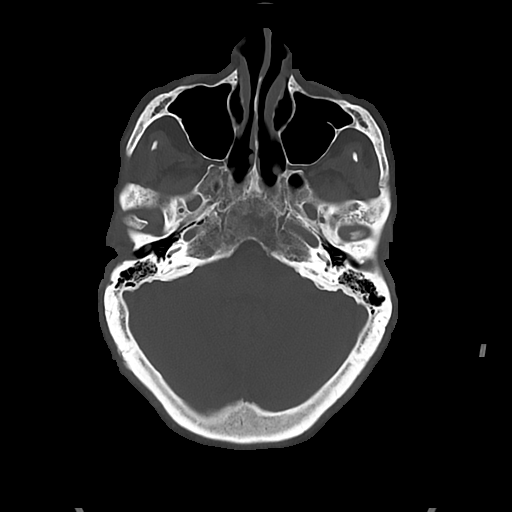
[im 25/81  bone]
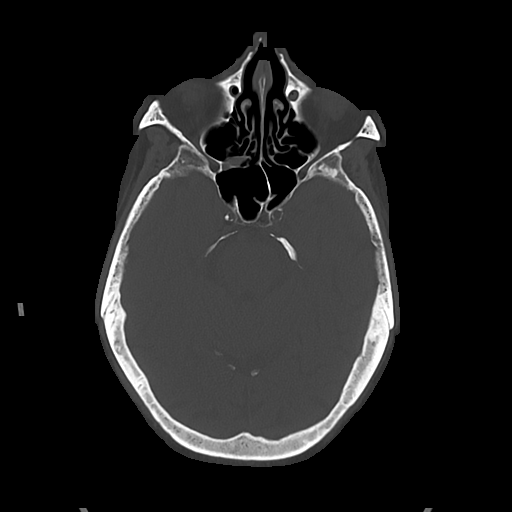
[im 37/81  bone]
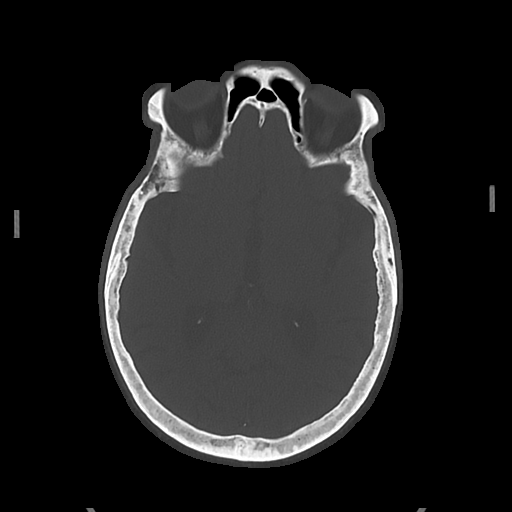

[Series 5: cor soft · coronal · 0.35mm/px · 3 of 71 slices shown]
[im 24/71  brain]
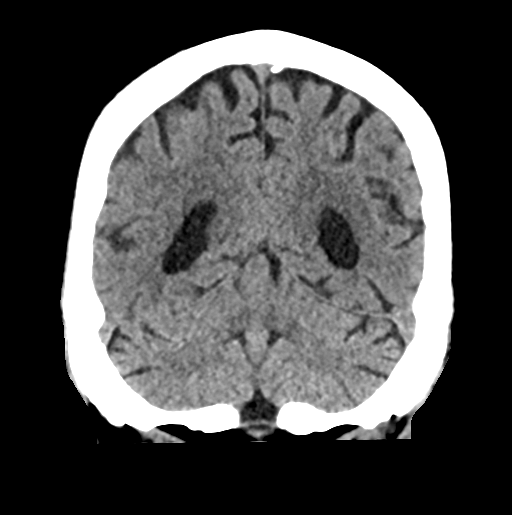
[im 32/71  brain]
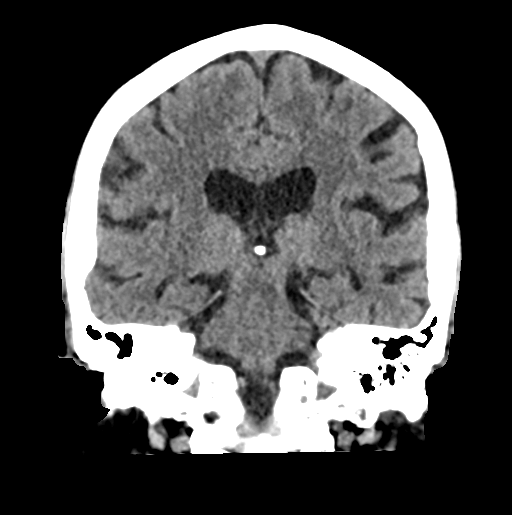
[im 39/71  brain]
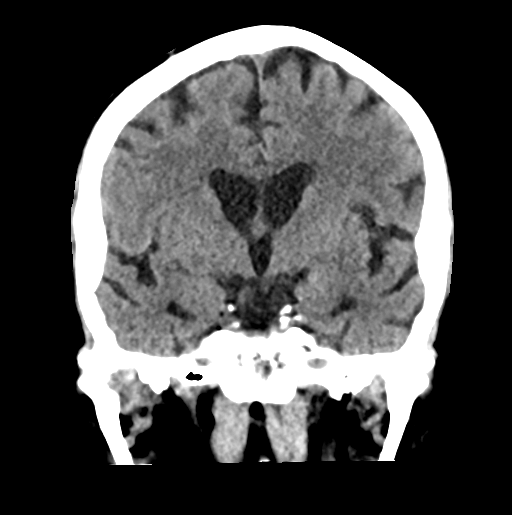

[Series 6: sag soft · sagittal · 0.35mm/px · 3 of 60 slices shown]
[im 20/60  brain]
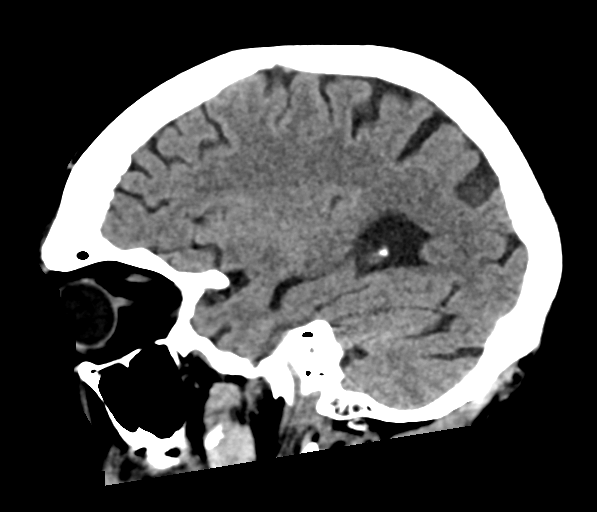
[im 30/60  brain]
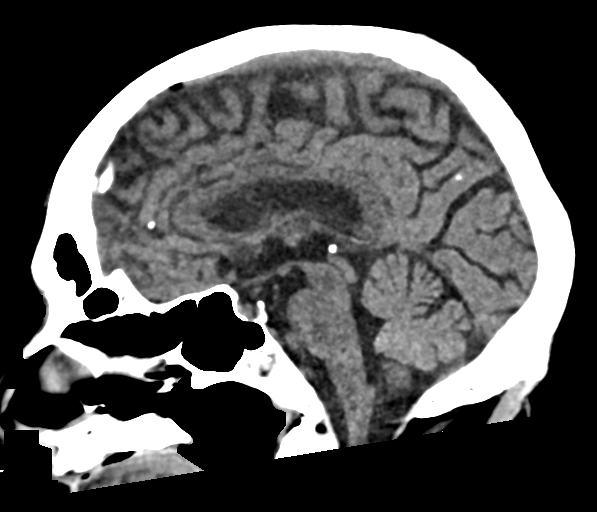
[im 40/60  brain]
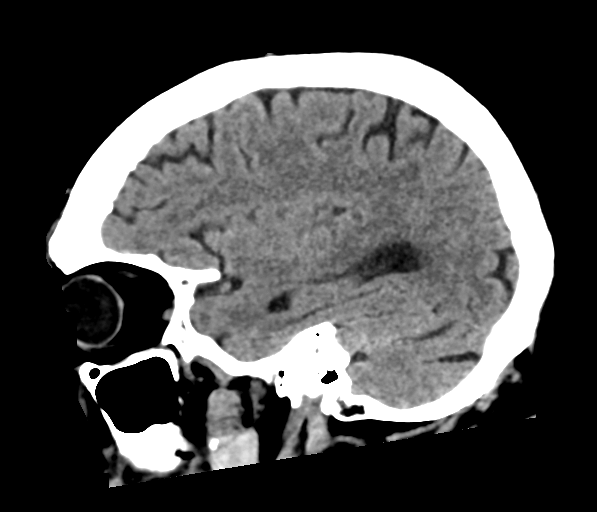

[17 of 47 positions shown; findings below may reference images not displayed]

FINDINGS: Brain:

Mild cerebral and cerebellar atrophy.

Mild ill-defined hypoattenuation within the cerebral white matter is
nonspecific, but compatible with chronic small vessel ischemic
disease.

There is no acute intracranial hemorrhage.

No demarcated cortical infarct.

No extra-axial fluid collection.

No evidence of intracranial mass.

No midline shift.

Vascular: No hyperdense vessel.  Atherosclerotic calcifications.

Skull: Normal. Negative for fracture or focal lesion.

Sinuses/Orbits: Visualized orbits show no acute finding. Mild
scattered ethmoid sinus mucosal thickening.

Other: Partially imaged lipoma within the right upper neck measuring
at least 2.8 cm.
IMPRESSION: No evidence of acute intracranial abnormality.

Mild generalized atrophy of the brain, progressed from the head CT
of 11/07/2013.

Stable mild cerebral white matter chronic small vessel ischemic
disease.

Mild ethmoid sinus mucosal thickening.

Partially imaged lipoma within the right upper neck measuring at
least 2.8 cm.
